# Patient Record
Sex: Female | Born: 1960 | Race: White | Hispanic: No | Marital: Married | Smoking: Never smoker
Health system: Southern US, Community
[De-identification: ages and names within clinical notes are randomized; demographics above are authoritative.]

## PROBLEM LIST (undated history)

## (undated) DIAGNOSIS — R51 Headache: Secondary | ICD-10-CM

## (undated) DIAGNOSIS — I10 Essential (primary) hypertension: Secondary | ICD-10-CM

## (undated) DIAGNOSIS — R790 Abnormal level of blood mineral: Secondary | ICD-10-CM

## (undated) DIAGNOSIS — M1990 Unspecified osteoarthritis, unspecified site: Secondary | ICD-10-CM

## (undated) DIAGNOSIS — E7800 Pure hypercholesterolemia, unspecified: Secondary | ICD-10-CM

## (undated) DIAGNOSIS — K760 Fatty (change of) liver, not elsewhere classified: Secondary | ICD-10-CM

## (undated) DIAGNOSIS — E119 Type 2 diabetes mellitus without complications: Secondary | ICD-10-CM

## (undated) DIAGNOSIS — Z86711 Personal history of pulmonary embolism: Secondary | ICD-10-CM

## (undated) DIAGNOSIS — K589 Irritable bowel syndrome without diarrhea: Secondary | ICD-10-CM

## (undated) DIAGNOSIS — F32A Depression, unspecified: Secondary | ICD-10-CM

## (undated) DIAGNOSIS — K529 Noninfective gastroenteritis and colitis, unspecified: Secondary | ICD-10-CM

## (undated) DIAGNOSIS — E785 Hyperlipidemia, unspecified: Secondary | ICD-10-CM

## (undated) DIAGNOSIS — F329 Major depressive disorder, single episode, unspecified: Secondary | ICD-10-CM

## (undated) DIAGNOSIS — N189 Chronic kidney disease, unspecified: Secondary | ICD-10-CM

## (undated) HISTORY — DX: Noninfective gastroenteritis and colitis, unspecified: K52.9

## (undated) HISTORY — PX: TONSILLECTOMY: SUR1361

## (undated) HISTORY — DX: Chronic kidney disease, unspecified: N18.9

## (undated) HISTORY — PX: AMPUTATION: SHX166

## (undated) HISTORY — DX: Irritable bowel syndrome, unspecified: K58.9

## (undated) HISTORY — PX: MANDIBLE FRACTURE SURGERY: SHX706

## (undated) HISTORY — DX: Personal history of pulmonary embolism: Z86.711

## (undated) HISTORY — DX: Essential (primary) hypertension: I10

## (undated) HISTORY — PX: PARTIAL HYSTERECTOMY: SHX80

## (undated) HISTORY — DX: Hemochromatosis, unspecified: E83.119

## (undated) HISTORY — DX: Type 2 diabetes mellitus without complications: E11.9

---

## 1997-05-09 ENCOUNTER — Encounter: Admission: RE | Admit: 1997-05-09 | Discharge: 1997-05-09 | Payer: Self-pay | Admitting: Family Medicine

## 1997-05-23 ENCOUNTER — Encounter: Admission: RE | Admit: 1997-05-23 | Discharge: 1997-05-23 | Payer: Self-pay | Admitting: Family Medicine

## 1997-05-29 ENCOUNTER — Encounter: Admission: RE | Admit: 1997-05-29 | Discharge: 1997-05-29 | Payer: Self-pay | Admitting: Family Medicine

## 1997-06-03 ENCOUNTER — Encounter: Admission: RE | Admit: 1997-06-03 | Discharge: 1997-06-03 | Payer: Self-pay | Admitting: Family Medicine

## 1997-06-05 ENCOUNTER — Encounter: Admission: RE | Admit: 1997-06-05 | Discharge: 1997-06-05 | Payer: Self-pay | Admitting: Family Medicine

## 1997-06-10 ENCOUNTER — Encounter: Admission: RE | Admit: 1997-06-10 | Discharge: 1997-06-10 | Payer: Self-pay | Admitting: Family Medicine

## 1997-08-01 ENCOUNTER — Encounter: Admission: RE | Admit: 1997-08-01 | Discharge: 1997-08-01 | Payer: Self-pay | Admitting: Family Medicine

## 1997-08-27 ENCOUNTER — Encounter: Admission: RE | Admit: 1997-08-27 | Discharge: 1997-08-27 | Payer: Self-pay | Admitting: Family Medicine

## 1997-11-15 ENCOUNTER — Encounter: Admission: RE | Admit: 1997-11-15 | Discharge: 1997-11-15 | Payer: Self-pay | Admitting: Family Medicine

## 1997-12-05 ENCOUNTER — Encounter: Admission: RE | Admit: 1997-12-05 | Discharge: 1997-12-05 | Payer: Self-pay | Admitting: Family Medicine

## 1998-01-22 ENCOUNTER — Emergency Department (HOSPITAL_COMMUNITY): Admission: EM | Admit: 1998-01-22 | Discharge: 1998-01-22 | Payer: Self-pay | Admitting: Emergency Medicine

## 1998-03-18 ENCOUNTER — Encounter: Admission: RE | Admit: 1998-03-18 | Discharge: 1998-03-18 | Payer: Self-pay | Admitting: Sports Medicine

## 1998-03-20 ENCOUNTER — Encounter: Admission: RE | Admit: 1998-03-20 | Discharge: 1998-03-20 | Payer: Self-pay | Admitting: Family Medicine

## 1998-03-24 ENCOUNTER — Encounter: Admission: RE | Admit: 1998-03-24 | Discharge: 1998-03-24 | Payer: Self-pay | Admitting: Sports Medicine

## 1998-04-03 ENCOUNTER — Encounter: Admission: RE | Admit: 1998-04-03 | Discharge: 1998-04-03 | Payer: Self-pay | Admitting: Family Medicine

## 1998-04-10 ENCOUNTER — Encounter: Admission: RE | Admit: 1998-04-10 | Discharge: 1998-04-10 | Payer: Self-pay | Admitting: Family Medicine

## 1998-04-22 ENCOUNTER — Encounter: Admission: RE | Admit: 1998-04-22 | Discharge: 1998-04-22 | Payer: Self-pay | Admitting: Family Medicine

## 1998-05-01 ENCOUNTER — Encounter: Admission: RE | Admit: 1998-05-01 | Discharge: 1998-05-01 | Payer: Self-pay | Admitting: Family Medicine

## 1998-05-19 ENCOUNTER — Encounter: Admission: RE | Admit: 1998-05-19 | Discharge: 1998-05-19 | Payer: Self-pay | Admitting: Family Medicine

## 1998-06-05 ENCOUNTER — Encounter: Admission: RE | Admit: 1998-06-05 | Discharge: 1998-06-05 | Payer: Self-pay | Admitting: Family Medicine

## 1998-06-12 ENCOUNTER — Encounter: Admission: RE | Admit: 1998-06-12 | Discharge: 1998-06-12 | Payer: Self-pay | Admitting: Family Medicine

## 1998-06-23 ENCOUNTER — Encounter: Admission: RE | Admit: 1998-06-23 | Discharge: 1998-06-23 | Payer: Self-pay | Admitting: Family Medicine

## 1998-07-01 ENCOUNTER — Encounter: Admission: RE | Admit: 1998-07-01 | Discharge: 1998-07-01 | Payer: Self-pay | Admitting: Sports Medicine

## 1998-07-03 ENCOUNTER — Encounter: Admission: RE | Admit: 1998-07-03 | Discharge: 1998-07-03 | Payer: Self-pay | Admitting: Family Medicine

## 1998-07-15 ENCOUNTER — Encounter: Admission: RE | Admit: 1998-07-15 | Discharge: 1998-07-15 | Payer: Self-pay | Admitting: Obstetrics & Gynecology

## 1998-08-05 ENCOUNTER — Encounter: Admission: RE | Admit: 1998-08-05 | Discharge: 1998-08-05 | Payer: Self-pay | Admitting: Obstetrics & Gynecology

## 1998-08-28 ENCOUNTER — Encounter: Admission: RE | Admit: 1998-08-28 | Discharge: 1998-08-28 | Payer: Self-pay | Admitting: Family Medicine

## 1998-09-08 ENCOUNTER — Encounter: Admission: RE | Admit: 1998-09-08 | Discharge: 1998-09-08 | Payer: Self-pay | Admitting: Family Medicine

## 1998-09-16 ENCOUNTER — Encounter: Admission: RE | Admit: 1998-09-16 | Discharge: 1998-09-16 | Payer: Self-pay | Admitting: Family Medicine

## 1998-09-17 ENCOUNTER — Encounter: Admission: RE | Admit: 1998-09-17 | Discharge: 1998-09-17 | Payer: Self-pay | Admitting: Family Medicine

## 1998-09-25 ENCOUNTER — Encounter: Admission: RE | Admit: 1998-09-25 | Discharge: 1998-09-25 | Payer: Self-pay | Admitting: Family Medicine

## 1998-11-04 ENCOUNTER — Encounter: Admission: RE | Admit: 1998-11-04 | Discharge: 1998-11-04 | Payer: Self-pay | Admitting: Family Medicine

## 1998-11-06 ENCOUNTER — Encounter: Admission: RE | Admit: 1998-11-06 | Discharge: 1998-11-06 | Payer: Self-pay | Admitting: Family Medicine

## 1998-12-17 ENCOUNTER — Encounter: Admission: RE | Admit: 1998-12-17 | Discharge: 1998-12-17 | Payer: Self-pay | Admitting: Family Medicine

## 1998-12-29 ENCOUNTER — Encounter: Admission: RE | Admit: 1998-12-29 | Discharge: 1998-12-29 | Payer: Self-pay | Admitting: Family Medicine

## 1999-01-20 ENCOUNTER — Encounter: Admission: RE | Admit: 1999-01-20 | Discharge: 1999-01-20 | Payer: Self-pay | Admitting: Family Medicine

## 1999-01-26 ENCOUNTER — Encounter: Admission: RE | Admit: 1999-01-26 | Discharge: 1999-01-26 | Payer: Self-pay | Admitting: Family Medicine

## 1999-02-19 ENCOUNTER — Encounter: Admission: RE | Admit: 1999-02-19 | Discharge: 1999-02-19 | Payer: Self-pay | Admitting: Family Medicine

## 1999-03-02 ENCOUNTER — Encounter: Admission: RE | Admit: 1999-03-02 | Discharge: 1999-03-02 | Payer: Self-pay | Admitting: Family Medicine

## 1999-03-09 ENCOUNTER — Encounter: Admission: RE | Admit: 1999-03-09 | Discharge: 1999-03-09 | Payer: Self-pay | Admitting: Family Medicine

## 1999-03-09 ENCOUNTER — Other Ambulatory Visit: Admission: RE | Admit: 1999-03-09 | Discharge: 1999-03-09 | Payer: Self-pay | Admitting: Family Medicine

## 1999-03-17 ENCOUNTER — Encounter: Admission: RE | Admit: 1999-03-17 | Discharge: 1999-03-17 | Payer: Self-pay | Admitting: Sports Medicine

## 1999-05-05 ENCOUNTER — Encounter: Admission: RE | Admit: 1999-05-05 | Discharge: 1999-05-05 | Payer: Self-pay | Admitting: Family Medicine

## 1999-05-13 ENCOUNTER — Encounter: Admission: RE | Admit: 1999-05-13 | Discharge: 1999-05-13 | Payer: Self-pay | Admitting: Family Medicine

## 1999-05-21 ENCOUNTER — Encounter: Admission: RE | Admit: 1999-05-21 | Discharge: 1999-05-21 | Payer: Self-pay | Admitting: Family Medicine

## 1999-06-02 ENCOUNTER — Encounter: Admission: RE | Admit: 1999-06-02 | Discharge: 1999-06-02 | Payer: Self-pay | Admitting: Sports Medicine

## 1999-06-17 ENCOUNTER — Encounter: Admission: RE | Admit: 1999-06-17 | Discharge: 1999-06-17 | Payer: Self-pay | Admitting: Family Medicine

## 1999-06-18 ENCOUNTER — Encounter: Admission: RE | Admit: 1999-06-18 | Discharge: 1999-06-18 | Payer: Self-pay | Admitting: Family Medicine

## 1999-06-18 ENCOUNTER — Encounter: Admission: RE | Admit: 1999-06-18 | Discharge: 1999-06-18 | Payer: Self-pay | Admitting: *Deleted

## 1999-08-10 ENCOUNTER — Encounter: Admission: RE | Admit: 1999-08-10 | Discharge: 1999-08-10 | Payer: Self-pay | Admitting: Family Medicine

## 1999-08-13 ENCOUNTER — Encounter: Admission: RE | Admit: 1999-08-13 | Discharge: 1999-08-13 | Payer: Self-pay | Admitting: Family Medicine

## 1999-09-03 ENCOUNTER — Encounter: Admission: RE | Admit: 1999-09-03 | Discharge: 1999-09-03 | Payer: Self-pay | Admitting: Family Medicine

## 1999-09-04 ENCOUNTER — Encounter: Admission: RE | Admit: 1999-09-04 | Discharge: 1999-09-04 | Payer: Self-pay | Admitting: Family Medicine

## 1999-09-29 ENCOUNTER — Encounter: Admission: RE | Admit: 1999-09-29 | Discharge: 1999-09-29 | Payer: Self-pay | Admitting: Family Medicine

## 1999-10-16 ENCOUNTER — Encounter: Admission: RE | Admit: 1999-10-16 | Discharge: 1999-10-16 | Payer: Self-pay | Admitting: Family Medicine

## 1999-10-21 ENCOUNTER — Encounter: Admission: RE | Admit: 1999-10-21 | Discharge: 1999-10-21 | Payer: Self-pay | Admitting: *Deleted

## 1999-11-10 ENCOUNTER — Encounter: Payer: Self-pay | Admitting: *Deleted

## 1999-11-10 ENCOUNTER — Encounter: Admission: RE | Admit: 1999-11-10 | Discharge: 1999-11-10 | Payer: Self-pay | Admitting: *Deleted

## 2000-03-10 ENCOUNTER — Encounter: Admission: RE | Admit: 2000-03-10 | Discharge: 2000-03-10 | Payer: Self-pay | Admitting: Family Medicine

## 2000-04-08 ENCOUNTER — Encounter (INDEPENDENT_AMBULATORY_CARE_PROVIDER_SITE_OTHER): Payer: Self-pay | Admitting: *Deleted

## 2000-04-11 ENCOUNTER — Encounter: Admission: RE | Admit: 2000-04-11 | Discharge: 2000-04-11 | Payer: Self-pay | Admitting: Family Medicine

## 2000-06-15 ENCOUNTER — Encounter: Admission: RE | Admit: 2000-06-15 | Discharge: 2000-06-15 | Payer: Self-pay | Admitting: Family Medicine

## 2000-07-07 ENCOUNTER — Encounter: Admission: RE | Admit: 2000-07-07 | Discharge: 2000-07-07 | Payer: Self-pay | Admitting: Family Medicine

## 2000-07-12 ENCOUNTER — Encounter: Admission: RE | Admit: 2000-07-12 | Discharge: 2000-07-12 | Payer: Self-pay | Admitting: Family Medicine

## 2000-08-04 ENCOUNTER — Encounter: Admission: RE | Admit: 2000-08-04 | Discharge: 2000-08-04 | Payer: Self-pay | Admitting: Family Medicine

## 2000-08-29 ENCOUNTER — Encounter: Admission: RE | Admit: 2000-08-29 | Discharge: 2000-08-29 | Payer: Self-pay | Admitting: Family Medicine

## 2000-08-29 ENCOUNTER — Encounter: Payer: Self-pay | Admitting: Family Medicine

## 2001-02-13 ENCOUNTER — Ambulatory Visit (HOSPITAL_COMMUNITY): Admission: RE | Admit: 2001-02-13 | Discharge: 2001-02-13 | Payer: Self-pay | Admitting: Internal Medicine

## 2001-02-13 ENCOUNTER — Encounter (INDEPENDENT_AMBULATORY_CARE_PROVIDER_SITE_OTHER): Payer: Self-pay | Admitting: Internal Medicine

## 2001-03-01 ENCOUNTER — Ambulatory Visit (HOSPITAL_COMMUNITY): Admission: RE | Admit: 2001-03-01 | Discharge: 2001-03-01 | Payer: Self-pay | Admitting: Internal Medicine

## 2001-03-08 ENCOUNTER — Encounter (INDEPENDENT_AMBULATORY_CARE_PROVIDER_SITE_OTHER): Payer: Self-pay | Admitting: Internal Medicine

## 2001-03-08 ENCOUNTER — Ambulatory Visit (HOSPITAL_COMMUNITY): Admission: RE | Admit: 2001-03-08 | Discharge: 2001-03-08 | Payer: Self-pay | Admitting: Internal Medicine

## 2001-05-26 ENCOUNTER — Encounter: Payer: Self-pay | Admitting: Gastroenterology

## 2001-05-26 ENCOUNTER — Ambulatory Visit (HOSPITAL_COMMUNITY): Admission: RE | Admit: 2001-05-26 | Discharge: 2001-05-26 | Payer: Self-pay | Admitting: Gastroenterology

## 2001-07-11 ENCOUNTER — Encounter: Admission: RE | Admit: 2001-07-11 | Discharge: 2001-07-11 | Payer: Self-pay | Admitting: Family Medicine

## 2001-09-12 ENCOUNTER — Encounter: Admission: RE | Admit: 2001-09-12 | Discharge: 2001-12-11 | Payer: Self-pay | Admitting: Family Medicine

## 2001-09-28 ENCOUNTER — Encounter: Payer: Self-pay | Admitting: Family Medicine

## 2001-09-28 ENCOUNTER — Encounter: Admission: RE | Admit: 2001-09-28 | Discharge: 2001-09-28 | Payer: Self-pay | Admitting: Family Medicine

## 2001-10-14 ENCOUNTER — Emergency Department (HOSPITAL_COMMUNITY): Admission: EM | Admit: 2001-10-14 | Discharge: 2001-10-14 | Payer: Self-pay | Admitting: *Deleted

## 2002-04-17 ENCOUNTER — Emergency Department (HOSPITAL_COMMUNITY): Admission: EM | Admit: 2002-04-17 | Discharge: 2002-04-17 | Payer: Self-pay | Admitting: Emergency Medicine

## 2002-04-17 ENCOUNTER — Encounter: Payer: Self-pay | Admitting: Emergency Medicine

## 2002-05-03 ENCOUNTER — Encounter: Payer: Self-pay | Admitting: Family Medicine

## 2002-05-03 ENCOUNTER — Encounter: Admission: RE | Admit: 2002-05-03 | Discharge: 2002-05-03 | Payer: Self-pay | Admitting: Family Medicine

## 2002-08-02 ENCOUNTER — Encounter (INDEPENDENT_AMBULATORY_CARE_PROVIDER_SITE_OTHER): Payer: Self-pay | Admitting: *Deleted

## 2002-08-02 ENCOUNTER — Observation Stay (HOSPITAL_COMMUNITY): Admission: RE | Admit: 2002-08-02 | Discharge: 2002-08-03 | Payer: Self-pay | Admitting: Obstetrics and Gynecology

## 2004-05-22 ENCOUNTER — Emergency Department (HOSPITAL_COMMUNITY): Admission: EM | Admit: 2004-05-22 | Discharge: 2004-05-22 | Payer: Self-pay | Admitting: Emergency Medicine

## 2004-07-09 ENCOUNTER — Other Ambulatory Visit: Admission: RE | Admit: 2004-07-09 | Discharge: 2004-07-09 | Payer: Self-pay | Admitting: Obstetrics and Gynecology

## 2004-10-13 ENCOUNTER — Ambulatory Visit: Payer: Self-pay | Admitting: Gastroenterology

## 2004-10-15 ENCOUNTER — Ambulatory Visit: Payer: Self-pay | Admitting: Gastroenterology

## 2004-10-15 ENCOUNTER — Encounter (INDEPENDENT_AMBULATORY_CARE_PROVIDER_SITE_OTHER): Payer: Self-pay | Admitting: Specialist

## 2004-10-27 ENCOUNTER — Ambulatory Visit: Payer: Self-pay | Admitting: Gastroenterology

## 2005-09-28 ENCOUNTER — Ambulatory Visit: Payer: Self-pay | Admitting: Gastroenterology

## 2005-11-11 ENCOUNTER — Ambulatory Visit: Payer: Self-pay | Admitting: Gastroenterology

## 2005-11-25 ENCOUNTER — Ambulatory Visit: Payer: Self-pay | Admitting: Gastroenterology

## 2006-04-08 ENCOUNTER — Encounter (INDEPENDENT_AMBULATORY_CARE_PROVIDER_SITE_OTHER): Payer: Self-pay | Admitting: *Deleted

## 2006-07-23 ENCOUNTER — Emergency Department (HOSPITAL_COMMUNITY): Admission: EM | Admit: 2006-07-23 | Discharge: 2006-07-23 | Payer: Self-pay | Admitting: Emergency Medicine

## 2006-10-26 ENCOUNTER — Ambulatory Visit: Payer: Self-pay | Admitting: Gastroenterology

## 2006-11-02 ENCOUNTER — Ambulatory Visit (HOSPITAL_COMMUNITY): Admission: RE | Admit: 2006-11-02 | Discharge: 2006-11-02 | Payer: Self-pay | Admitting: Gastroenterology

## 2006-12-15 ENCOUNTER — Encounter: Admission: RE | Admit: 2006-12-15 | Discharge: 2006-12-15 | Payer: Self-pay | Admitting: Family Medicine

## 2007-04-25 DIAGNOSIS — R142 Eructation: Secondary | ICD-10-CM

## 2007-04-25 DIAGNOSIS — R141 Gas pain: Secondary | ICD-10-CM | POA: Insufficient documentation

## 2007-04-25 DIAGNOSIS — R143 Flatulence: Secondary | ICD-10-CM

## 2007-04-25 DIAGNOSIS — R197 Diarrhea, unspecified: Secondary | ICD-10-CM | POA: Insufficient documentation

## 2007-04-25 DIAGNOSIS — K5909 Other constipation: Secondary | ICD-10-CM | POA: Insufficient documentation

## 2007-04-25 DIAGNOSIS — R110 Nausea: Secondary | ICD-10-CM | POA: Insufficient documentation

## 2007-04-25 DIAGNOSIS — K589 Irritable bowel syndrome without diarrhea: Secondary | ICD-10-CM | POA: Insufficient documentation

## 2007-07-10 ENCOUNTER — Encounter: Admission: RE | Admit: 2007-07-10 | Discharge: 2007-07-10 | Payer: Self-pay | Admitting: Family Medicine

## 2008-04-01 ENCOUNTER — Encounter: Admission: RE | Admit: 2008-04-01 | Discharge: 2008-04-01 | Payer: Self-pay | Admitting: Family Medicine

## 2008-07-30 ENCOUNTER — Encounter: Payer: Self-pay | Admitting: Gastroenterology

## 2008-10-20 ENCOUNTER — Encounter: Payer: Self-pay | Admitting: Gastroenterology

## 2008-11-05 ENCOUNTER — Telehealth: Payer: Self-pay | Admitting: Gastroenterology

## 2008-11-07 ENCOUNTER — Ambulatory Visit: Payer: Self-pay | Admitting: Gastroenterology

## 2008-11-07 DIAGNOSIS — R159 Full incontinence of feces: Secondary | ICD-10-CM | POA: Insufficient documentation

## 2008-11-11 ENCOUNTER — Ambulatory Visit: Payer: Self-pay | Admitting: Gastroenterology

## 2008-11-11 ENCOUNTER — Encounter: Payer: Self-pay | Admitting: Gastroenterology

## 2008-11-18 ENCOUNTER — Encounter: Payer: Self-pay | Admitting: Gastroenterology

## 2008-11-25 ENCOUNTER — Telehealth: Payer: Self-pay | Admitting: Physician Assistant

## 2008-12-13 ENCOUNTER — Telehealth: Payer: Self-pay | Admitting: Gastroenterology

## 2008-12-16 ENCOUNTER — Ambulatory Visit: Payer: Self-pay | Admitting: Gastroenterology

## 2008-12-17 ENCOUNTER — Encounter: Payer: Self-pay | Admitting: Gastroenterology

## 2009-01-20 ENCOUNTER — Ambulatory Visit: Payer: Self-pay | Admitting: Gastroenterology

## 2009-02-26 ENCOUNTER — Telehealth: Payer: Self-pay | Admitting: Gastroenterology

## 2009-03-07 ENCOUNTER — Telehealth (INDEPENDENT_AMBULATORY_CARE_PROVIDER_SITE_OTHER): Payer: Self-pay | Admitting: *Deleted

## 2009-03-21 ENCOUNTER — Telehealth: Payer: Self-pay | Admitting: Gastroenterology

## 2009-04-07 ENCOUNTER — Telehealth: Payer: Self-pay | Admitting: Gastroenterology

## 2009-04-21 ENCOUNTER — Encounter: Payer: Self-pay | Admitting: Gastroenterology

## 2009-06-30 ENCOUNTER — Telehealth: Payer: Self-pay | Admitting: Gastroenterology

## 2010-03-01 ENCOUNTER — Encounter: Payer: Self-pay | Admitting: Gastroenterology

## 2010-03-01 ENCOUNTER — Encounter: Payer: Self-pay | Admitting: Family Medicine

## 2010-03-10 NOTE — Progress Notes (Signed)
Summary: Triage  Phone Note Call from Patient Call back at Home Phone [phone]   Caller: Patient Call For: Dr. Arlyce Dice Reason for Call: Talk to Nurse Summary of Call: pt. would like to speak directly to nurse Initial call taken by: Karna Christmas,  Jun 30, 2009 3:07 PM  Follow-up for Phone Call        Message left to call back   Teryl Lucy RN  Jun 30, 2009 4:25 PM   Additional Follow-up for Phone Call Additional follow up Details #1::        C/O continued stool incontinence, unable to see surgical specialist due to insurance restrictions. Pt. will see Dr.Aryka Coonradt on 07-03-09 at 9:30am. Additional Follow-up by: Laureen Ochs LPN,  Jul 01, 2009 9:40 AM

## 2010-03-10 NOTE — Progress Notes (Signed)
Summary: Surgical Consult Scheduled  ---- Converted from flag ---- ---- 04/07/2009 11:12 AM, Louis Meckel MD wrote: Pt needs app't with Dr. Antionette Char and copy of her anal manometry report (in folder). ------------------------------  Phone Note Outgoing Call   Call placed by: Laureen Ochs LPN,  April 07, 2009 2:14 PM Call placed to: Patient Summary of Call: Per Dr.Kaplan--Pt. needs to see Dr.Ingram for a surgical consult, not Dr.Jackson-Moore. Records faxed to [facility], Appt. is scheduled for 04-21-09 at 10:30am. Message left for patient to callback.  Initial call taken by: Laureen Ochs LPN,  April 07, 2009 2:23 PM  Follow-up for Phone Call        Message left for patient to callback. Laureen Ochs LPN  April 08, 1608 9:02 AM  Above MD orders reviewed with patient. Pt. instructed to call back as needed.  Follow-up by: Laureen Ochs LPN,  April 08, 2009 10:38 AM

## 2010-03-10 NOTE — Op Note (Signed)
Summary: Anorectal Manometry/[facility]  Anorectal Manometry/[facility]   Imported By: Sherian Rein 04/09/2009 13:20:43  _____________________________________________________________________  External Attachment:    Type:   Image     Comment:   External Document

## 2010-03-10 NOTE — Progress Notes (Signed)
Summary: x-rays?  Phone Note Call from Patient Call back at Home Phone [phone]   Caller: Patient Call For: Dr. Arlyce Dice Reason for Call: Talk to Nurse Summary of Call: wanted to know if we ahve received x-rays from [facility] Initial call taken by: Vallarie Mare,  March 21, 2009 2:26 PM  Follow-up for Phone Call        Returned pts call, She wanted to know if we recieved her anal manometrey from [place]. Explained to her that we have not seen it yet. I pulled up her signed medical release and put Dr Nita Sells direct fax on it with attention to me and refaxed to [phone]. Follow-up by: Merri Ray CMA Duncan Dull),  March 21, 2009 2:41 PM

## 2010-03-10 NOTE — Progress Notes (Signed)
Summary: Medical Release completed  Medical Release completed. Faxed to [phone] for records for Dr. Arlyce Dice to review. [name]  March 07, 2009 11:43 AM

## 2010-03-10 NOTE — Progress Notes (Signed)
Summary: TRIAGE  Phone Note Call from Patient Call back at [phone]   Caller: Patient Call For: Arlyce Dice Reason for Call: Talk to Nurse Summary of Call: Patient states that she still has a lot of bowel incontinence and has alot of gas with it wants to know what to do. Initial call taken by: Tawni Levy,  February 26, 2009 10:08 AM  Follow-up for Phone Call         Last OV on 01-20-09. Her insurance won't cover another Anal Manometry because she already had one done in 2004.   Pt. continues with stool incontinence/leakage, she has to wear pads because of the stool issues. Now c/o increased gas/bloating. She does take fiber daily. Uses Beano as needed.  [facility] PLEASE ADVISE   Follow-up by: Laureen Ochs LPN,  February 26, 2009 10:16 AM  Additional Follow-up for Phone Call Additional follow up Details #1::        Need anal manometry report Additional Follow-up by: Louis Meckel MD,  February 26, 2009 1:04 PM    Additional Follow-up for Phone Call Additional follow up Details #2::    Pt. states she cannot afford to pay for the Anal Manometry and her insurance won't approve it.  Please advise. Follow-up by: Laureen Ochs LPN,  February 26, 2009 1:10 PM  Additional Follow-up for Phone Call Additional follow up Details #3:: Details for Additional Follow-up Action Taken: I need the report of the previous anal manometry Additional Follow-up by: Louis Meckel MD,  February 28, 2009 10:59 AM  I asked pt. to get a copy of the Anal Mano. report faxed  to [place] for Dr.Kaplan to review, pt. states it should be in her chart, Dr.Kaplan sent her to have it done. I will request her chart.Laureen Ochs LPN  February 28, 2009 11:16 AM  I have throughly reviewed pt. chart, I see no order for an Anal Manometry or any reports. I called [facility] and they do not have any record of pt. having it done there. I have left a message for the pt. to please get a copy of the report from wherever she  did have it done and have it sent to [place]. Or, pt. can let me get an appt. for her to have it done. I have requested pt. to callback. Laureen Ochs LPN  February 28, 2009 2:38 PM  Pt. states she had the Anal Mano.at [facility], she will have them fax the report for Dr.Kaplans review.  Laureen Ochs LPN  March 03, 2009 9:38 AM  Pt. asked me to call [phone] to fax a request for the report. I have left a msg. at that # for a callback. Laureen Ochs LPN  March 03, 2009 10:21 AM  Still no answer at above #, I left another msg. I have advised the pt. that it will be her responsibility to get a copy of this report to our office for Dr.Kaplan to review. She will call me to let me know when the report will be comming. Pt. instructed to call back as needed.

## 2010-03-10 NOTE — Consult Note (Signed)
Summary: [facility]   Imported By: Sherian Rein 06/17/2009 09:38:32  _____________________________________________________________________  External Attachment:    Type:   Image     Comment:   External Document

## 2010-05-18 ENCOUNTER — Observation Stay (HOSPITAL_COMMUNITY): Payer: Medicare Other

## 2010-05-18 ENCOUNTER — Ambulatory Visit (INDEPENDENT_AMBULATORY_CARE_PROVIDER_SITE_OTHER): Payer: Medicare Other | Admitting: Cardiovascular Disease

## 2010-05-18 ENCOUNTER — Encounter: Payer: Self-pay | Admitting: Cardiovascular Disease

## 2010-05-18 ENCOUNTER — Encounter (HOSPITAL_COMMUNITY): Payer: Self-pay | Admitting: Radiology

## 2010-05-18 ENCOUNTER — Observation Stay (HOSPITAL_COMMUNITY)
Admission: AD | Admit: 2010-05-18 | Discharge: 2010-05-19 | Disposition: A | Discharge status: Home / Self Care | Payer: Medicare Other | Source: Ambulatory Visit | Attending: Cardiovascular Disease | Admitting: Cardiovascular Disease

## 2010-05-18 DIAGNOSIS — K7689 Other specified diseases of liver: Secondary | ICD-10-CM | POA: Insufficient documentation

## 2010-05-18 DIAGNOSIS — Z79899 Other long term (current) drug therapy: Secondary | ICD-10-CM | POA: Insufficient documentation

## 2010-05-18 DIAGNOSIS — E7800 Pure hypercholesterolemia, unspecified: Secondary | ICD-10-CM | POA: Insufficient documentation

## 2010-05-18 DIAGNOSIS — I498 Other specified cardiac arrhythmias: Secondary | ICD-10-CM | POA: Insufficient documentation

## 2010-05-18 DIAGNOSIS — R079 Chest pain, unspecified: Secondary | ICD-10-CM | POA: Insufficient documentation

## 2010-05-18 DIAGNOSIS — I10 Essential (primary) hypertension: Secondary | ICD-10-CM

## 2010-05-18 LAB — COMPREHENSIVE METABOLIC PANEL
ALT: 59 U/L — ABNORMAL HIGH (ref 0–35)
Albumin: 4 g/dL (ref 3.5–5.2)
Alkaline Phosphatase: 64 U/L (ref 39–117)
BUN: 18 mg/dL (ref 6–23)
Calcium: 10.1 mg/dL (ref 8.4–10.5)
Potassium: 3.4 mEq/L — ABNORMAL LOW (ref 3.5–5.1)
Sodium: 139 mEq/L (ref 135–145)
Total Protein: 7.1 g/dL (ref 6.0–8.3)

## 2010-05-18 LAB — CBC
HCT: 41.9 % (ref 36.0–46.0)
Hemoglobin: 14.9 g/dL (ref 12.0–15.0)
MCV: 92.7 fL (ref 78.0–100.0)
RBC: 4.52 MIL/uL (ref 3.87–5.11)
WBC: 6.9 10*3/uL (ref 4.0–10.5)

## 2010-05-18 LAB — CARDIAC PANEL(CRET KIN+CKTOT+MB+TROPI)
CK, MB: 1.5 ng/mL (ref 0.3–4.0)
Relative Index: 0.9 (ref 0.0–2.5)
Total CK: 174 U/L (ref 7–177)

## 2010-05-18 LAB — PROTIME-INR: INR: 0.9 (ref 0.00–1.49)

## 2010-05-18 LAB — LIPID PANEL
Cholesterol: 322 mg/dL — ABNORMAL HIGH (ref 0–200)
HDL: 47 mg/dL (ref >39)
LDL Cholesterol: 202 mg/dL — ABNORMAL HIGH (ref 0–99)
Total CHOL/HDL Ratio: 6.9 RATIO
VLDL: 73 mg/dL — ABNORMAL HIGH (ref 0–40)

## 2010-05-18 LAB — TSH: TSH: 1.153 u[IU]/mL (ref 0.350–4.500)

## 2010-05-18 MED ORDER — IOHEXOL 300 MG/ML  SOLN
100.0000 mL | Freq: Once | INTRAMUSCULAR | Status: AC | PRN
  Administered 2010-05-18: 100 mL via INTRAVENOUS

## 2010-05-18 NOTE — Assessment & Plan Note (Signed)
Her blood pressure is well-controlled. We'll continue with her same medications. 

## 2010-05-18 NOTE — Assessment & Plan Note (Signed)
Lynnelle presents with episodes of chest pain for the past for 5 days. These episodes can last as long as several hours. Is not relieved with any specific actions. It is not worsened with deep breathing, change position, eating, drinking.  She's been anxious with these episodes of chest pain. She notes that there is a lot of stressful events going on in her house.    Her EKG reveals sinus tachycardia does not reveal any ST or T wave changes. I think our best option is to do to the hospital. We'll get cardiac enzymes every 8 hours x3. We'll check an EKG in the morning. We'll keep her n.p.o. For possible stress test versus cardiac catheterization in the morning. We'll get a CT angio of  the chest to rule out pulmonary embolus.
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
## 2010-05-19 DIAGNOSIS — R079 Chest pain, unspecified: Secondary | ICD-10-CM

## 2010-05-19 LAB — CARDIAC PANEL(CRET KIN+CKTOT+MB+TROPI)
CK, MB: 1.2 ng/mL (ref 0.3–4.0)
CK, MB: 1.1 ng/mL (ref 0.3–4.0)
Relative Index: 0.9 (ref 0.0–2.5)
Relative Index: 1 (ref 0.0–2.5)
Total CK: 112 U/L (ref 7–177)

## 2010-05-25 NOTE — Discharge Summary (Signed)
NAMEMARGARITA, [name] NO.:  [id number]  MEDICAL RECORD NO.:  [id number]           PATIENT TYPE:  O  LOCATION:  3742                         FACILITY:  [facility]  PHYSICIAN:  Vesta Mixer, M.D. DATE OF BIRTH:  November 06, 1960  DATE OF ADMISSION:  05/18/2010 DATE OF DISCHARGE:  05/19/2010                              DISCHARGE SUMMARY   PRIMARY CARDIOLOGIST:  Vesta Mixer, MD  DISCHARGE DIAGNOSES: 1. Chest pain, noncardiac.  Ruled out for myocardial infarction this     admission. 2. Hypercholesterolemia. 3. Tachycardia. 4. Hypertension.  PROCEDURES/DIAGNOSTICS PERFORMED DURING HOSPITALIZATION: 1. CT angiography of the chest with contrast on May 18, 2010:     Negative for acute pulmonary embolism or other acute chest process.     Small posterior mediastinal/paraspinal well-circumscribed soft     tissue nodule, likely incidental.  Hepatic steatosis. 2. EKG on May 19, 2010:  Normal sinus rhythm with nonspecific T-wave     abnormalities.  Rate at 97 beats per minute.  Axis is normal.     Intervals are normal.  REASON FOR HOSPITALIZATION:  This is a 50 year old female with the above- stated problem list who presented to the outpatient clinic for follow up with Dr. Elease Hashimoto with complaints of intermittent chest pain.  The patient's chest pain started after doing yard work over the past several days.  This episode lasted about several hours.  There is radiation out to her left arm.  Unsure if she has associated shortness of breath.  The patient denied any further chest pain.  There is mild nausea.  Denies any diaphoresis, PND, orthopnea, or syncope.  Her initial EKG at the office demonstrated sinus tachycardia without acute ST-T-wave changes. The patient has been under increased stress at home lately.  It was decided that the patient should be admitted to the hospital for rule out for myocardial infarction.  HOSPITAL COURSE:  The patient was admitted to the  telemetry unit.  She had no further complaints of chest pain.  She underwent a CT angio of the chest to rule out pulmonary embolism, as above no PE was found.  The patient ruled out for myocardial infarction with cardiac enzymes being negative x3.  The patient again had no further complaints of chest pain. With the patient ruling out myocardial infarction, it was felt that the patient had noncardiac source of her chest pain.  Lipase and amylase were drawn, these were within normal limits.  The patient did have positive fatty liver on her CT angio.  This will be followed as an outpatient.  A cholesterol panel was obtained, the patient had elevated cholesterol at 322, triglycerides 364, HDL 47, and LDL of 202.  Dr. Elease Hashimoto added Lipitor 40 mg daily to the patient's regimen.  The patient was tachycardic on exam as well as initial EKG.  A beta-blocker was added to her daily regimen.  In the hospital, the patient had borderline pressures, systolic BP being in the low 100s, therefore the patient's Norvasc was discontinued with the addition of her metoprolol.  She will be continued on hydrochlorothiazide and olmesartan in at this time.  The patient ambulated in the halls without any difficulty.  She denied any shortness of breath, chest pain, or nausea or vomiting.  Her heart rate remained around 84.  Outpatient exercise was stressed to the patient, she voiced understanding.  Dr. Elease Hashimoto evaluated the patient on day of discharge and felt her stable for home.  DISCHARGE LABORATORY DATA:  Lipase 21, amylase 41.  Cardiac enzymes negative x3.  TSH 1.153.  Hemoglobin A1c 5.4.  DISCHARGE MEDICATIONS: 1. Hydrochlorothiazide 25 mg daily. 2. Lipitor 40 mg daily. 3. Metoprolol 25 mg 1 tablet twice daily. 4. Losartan 40 mg daily. 5. Cymbalta 60 mg one capsule daily. 6. Flexeril 10 mg one-half to 1 tablet 3 times a day as needed for     muscle spasms. 7. Imitrex 100 mg 1 tablet every 2 hours as  needed for migraines. 8. Vitamin B12 1000 mcg/mL, 1 mL intramuscularly every Friday. 9. Vitamin D2 50,000 one capsule every Friday, 10.Zolpidem 10 mg 1 tablet daily at bedtime as needed for sleep.  Please stop taking Tribenzor.  FOLLOWUP PLANS AND INSTRUCTIONS: 1. The patient will follow up with Dr. Elease Hashimoto on Jun 09, 2010, at 1:45     pounds. 2. The patient should increase activity slowly. 3. The patient should continue low-sodium, heart-healthy diet. 4. The patient should stop any activity that causes chest pain or     shortness of breath. 5. The patient is to call the office in the interim for any problems     or concerns.  Duration of Discharge: Greater than 30 minutes with physician and physician extender time.   [name], PA-C   ______________________________ Vesta Mixer, M.D.    NB/MEDQ  D:  05/19/2010  T:  05/20/2010  Job:  [id number]  Electronically Signed by Alen Blew P.A. on 05/21/2010 02:10:16 PM Electronically Signed by Kristeen Miss M.D. on 05/25/2010 03:13:54 PM

## 2010-06-09 ENCOUNTER — Ambulatory Visit (INDEPENDENT_AMBULATORY_CARE_PROVIDER_SITE_OTHER): Payer: Medicare Other | Admitting: Cardiovascular Disease

## 2010-06-09 ENCOUNTER — Encounter: Payer: Self-pay | Admitting: Cardiovascular Disease

## 2010-06-09 VITALS — BP 148/100 | HR 88 | Wt 173.0 lb

## 2010-06-09 DIAGNOSIS — I10 Essential (primary) hypertension: Secondary | ICD-10-CM

## 2010-06-09 DIAGNOSIS — R079 Chest pain, unspecified: Secondary | ICD-10-CM

## 2010-06-09 MED ORDER — METOPROLOL TARTRATE 50 MG PO TABS: 50.0000 mg | ORAL_TABLET | Freq: Two times a day (BID) | ORAL | Status: DC
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
## 2010-06-09 NOTE — Assessment & Plan Note (Signed)
She has not had any further episodes of chest pain.

## 2010-06-09 NOTE — Assessment & Plan Note (Signed)
Her blood pressure and heart rate remained a little elevated. We will increase her metoprolol from 25 p.o. B.i.d. To 50 mg p.o. B.i.d.  I'll see her in 3 months.

## 2010-06-23 NOTE — Assessment & Plan Note (Signed)
[facility]                         GASTROENTEROLOGY OFFICE NOTE   NAME:[name], [name]                       MRN:          [id number]  DATE:10/26/2006                            DOB:          07-10-1960    PROBLEM:  Nausea.   [name] has returned complaining of nausea.  This may occur on an  empty stomach but clearly is exacerbated post prandially.  She complains  of abdominal bloating and distention as well.  She is without pain or  pyrosis.  She underwent upper endoscopy two years ago for abdominal  pain, which was normal.  She has IBS, although currently is not  complaining of diarrhea.  She is taking two antihypertensives whose  names she does not recall.  She is also taking Ambien.   PHYSICAL EXAMINATION:  Pulse 100, blood pressure 110/72, weight 161.   PHYSICAL EXAMINATION:  HEENT: EOMI.  PERRLA.  Sclerae are anicteric.  Conjunctivae are pink.  NECK:  Supple without thyromegaly, adenopathy or carotid bruits.  CHEST:  Clear to auscultation and percussion without adventitious  sounds.  CARDIAC:  Regular rhythm; normal S1 S2.  There are no murmurs, gallops  or rubs.  ABDOMEN:  There is no succussion splash.  Abdomen is without masses,  tenderness, or organomegaly.  EXTREMITIES:  Full range of motion.  No cyanosis, clubbing or edema.  RECTAL:  Deferred.   IMPRESSION:  Nausea.  This could be a medication effect.  Gastroparesis  is also a consideration.  I think it is less likely that it is related  to gastroesophageal reflux disease.   RECOMMENDATIONS:  Gastric emptying scan.  I have instructed [name] to  bring in her medicines for review on our next visit.     Barbette Hair. Arlyce Dice, MD,FACG  Electronically Signed    RDK/MedQ  DD: 10/26/2006  DT: 10/26/2006  Job #: [id number]   cc:   Ace Gins, MD

## 2010-06-26 NOTE — Op Note (Signed)
NAMEANTONIETTE, [name]                          ACCOUNT NO.:  [id number]   MEDICAL RECORD NO.:  [id number]                   PATIENT TYPE:  OBV   LOCATION:  9399                                 FACILITY:  [facility]   PHYSICIAN:  Michelle L. Vincente Poli, M.D.            DATE OF BIRTH:  August 30, 1960   DATE OF PROCEDURE:  08/02/2002  DATE OF DISCHARGE:                                 OPERATIVE REPORT   PREOPERATIVE DIAGNOSES:  1. Pelvic pain.  2. Dysfunctional uterine bleeding.   POSTOPERATIVE DIAGNOSES:  1. Pelvic pain.  2. Dysfunctional uterine bleeding.   PROCEDURE:  Laparoscopic-assisted vaginal hysterectomy.   SURGEON:  Michelle L. Vincente Poli, M.D.   ASSISTANT:  Dr. Rosalia Hammers.   ANESTHESIA:  General.   ESTIMATED BLOOD LOSS:  200 mL.   FINDINGS:  Normal adnexa.  Uterus grossly consistent with adenomyosis.   PROCEDURE:  The patient was taken to the operating room, she was intubated  after she was positioned in the lithotomy position.  She was then prepped  and draped in the usual sterile fashion.  A Foley catheter was inserted into  the bladder after sterile drape was applied.  Using a scalpel a small  infraumbilical incision was made.  The Veress needle was inserted with the  first attempt.  The pneumoperitoneum was achieved and then a 10-11 mm trocar  was inserted in the same incision.  The patient was placed in Trendelenburg  position and the laparoscope was introduced and the secondary site trocar  was placed under direct visualization using the 5-mm incision.  Using a  probe, the anterior abdomen and pelvis was inspected.  The abdomen and  pelvis were normal.  Adnexa were normal.  There was no evidence of  endometriosis.  Using the Gyrus instrument, the triple pedicle was  identified and both sides were electrocoagulated and then it was transected  with good hemostasis.  We then carried this down to the round ligament on  both sides with good hemostasis.  Attention was then turned  vaginally.  The  patient's legs were then placed in high lithotomy and a circumferential  incision was made around the cervix and the overlying vaginal epithelium was  pushed away inferiorly, laterally, and posteriorly.  The posterior cul-de-  sac was entered sharply and a weighted speculum was inserted into the  posterior cul-de-sac.  Using the curved Haney clamps the uterosacral  cardinal complexes were then clamped on either side. Each pedicle was cut  and suture ligated using 0 Vicryl suture.  We then turned our attention  anteriorly where the anterior cul-de-sac was entered sharply.  We then  proceeded with going along the broad ligament using curved Haney clamps.  Each pedicle was cut and suture ligated using 0 Vicryl suture.  We then  reached the level of the triple pedicle which had already been transected on  either side.  The uterus was retroflexed and then the  uterus was then  removed.  A sterile sponge was inserted into the peritoneal cavity and the  pedicles were all inspected and noted to be hemostatic.  The posterior cuff  was closed with continuous running-lock stitch using 2-0 Vicryl suture.  The  ______ sponge was removed and the vaginal cuff was closed using 0 Vicryl in  a continuous running-lock stitch.  A sponge stick was placed in the vagina  and attention was then turned back to the abdomen.  The pneumoperitoneum was  then performed again.  All pedicles were inspected, irrigation was  performed, and hemostasis was noted.  At the end of the procedure all  pneumoperitoneum was released, the trocars were removed from the abdominal  cavity, and the incisions were closed with 3-0 Vicryl interrupted.  All  sponge, lap, and instrument counts were correct x2.  The patient tolerated  the procedure well and went to the recovery room in stable condition.                                               Michelle L. Vincente Poli, M.D.    Florestine Avers  D:  08/02/2002  T:  08/04/2002  Job:   [id number]

## 2010-06-26 NOTE — Assessment & Plan Note (Signed)
[facility]                           GASTROENTEROLOGY OFFICE NOTE   NAME:RYALSHennesy, Sobalvarro                       MRN:          [id number]  DATE:09/28/2005                            DOB:          April 03, 1960    PROBLEM:  Diarrhea.   Ms. Robinette has returned for reevaluation.  Diarrhea continues consisting of  poorly formed stools post prandial.  She will occasionally awaken with the  urge to defecate.  She is taking hyoscyamine without much relief.  Previous  biopsies of the rectum did not demonstrate any abnormalities.  Random  biopsies of the colon were also normal.   PHYSICAL EXAMINATION:  VITAL SIGNS:  Pulse 80, blood pressure 126/78, weight  143.   IMPRESSION:  Irritable bowel syndrome/diarrhea.   RECOMMENDATIONS:  Patient will consider enrollment in irritable bowel  syndrome trial.                                   Barbette Hair. Arlyce Dice, MD, [facility]   RDK/MedQ  DD:  09/28/2005  DT:  09/28/2005  Job #:  [id number]   cc:   Elvina Sidle, MD

## 2010-07-08 ENCOUNTER — Ambulatory Visit: Payer: Medicare Other | Admitting: Cardiology

## 2010-07-15 ENCOUNTER — Telehealth: Payer: Self-pay | Admitting: *Deleted

## 2010-07-15 DIAGNOSIS — E785 Hyperlipidemia, unspecified: Secondary | ICD-10-CM

## 2010-07-15 MED ORDER — LOSARTAN POTASSIUM 100 MG PO TABS: 100.0000 mg | ORAL_TABLET | Freq: Every day | ORAL | Status: DC

## 2010-07-15 MED ORDER — SIMVASTATIN 40 MG PO TABS: 40.0000 mg | ORAL_TABLET | Freq: Every day | ORAL | Status: DC

## 2010-07-15 NOTE — Telephone Encounter (Signed)
Pt walked in for med management. Diovan and lipitor not covered by insurance, pt has [employer] coverage. Pt on benicar for bp and nothing for cholesterol. Yesterday eve  bp 190/104 . Current 166/94. Feels ok currently, reviewed sodium content of foods to avoid. Pt was cleared of heart problems with last hospitalization. Pt obtaining pcp for HTN/hyperlipid management. Can you advise two diff meds. Pt warned about s/s of stroke with bp elevated and told to go to er if any complication and to monitor bp daily.

## 2010-07-15 NOTE — Telephone Encounter (Signed)
Spoke with DOD/ Dr Patty Sermons, I called pharmacy and they made suggestions to what her insurance would cover. Comparable meds were prescribed and escribed, pt then called and informed to pick up and start asap. Pt to monitor bp daily and call weekly with results till stable and to follow up for 3 mo labs here. App and orders placed. Pt verbalized understanding. Alfonso Ramus RN

## 2010-10-15 ENCOUNTER — Other Ambulatory Visit: Payer: Medicare Other | Admitting: *Deleted

## 2010-10-19 DIAGNOSIS — R0989 Other specified symptoms and signs involving the circulatory and respiratory systems: Secondary | ICD-10-CM | POA: Insufficient documentation

## 2010-10-19 DIAGNOSIS — S88111A Complete traumatic amputation at level between knee and ankle, right lower leg, initial encounter: Secondary | ICD-10-CM | POA: Insufficient documentation

## 2010-10-26 ENCOUNTER — Other Ambulatory Visit: Payer: Self-pay | Admitting: Sports Medicine

## 2010-10-26 ENCOUNTER — Ambulatory Visit
Admission: RE | Admit: 2010-10-26 | Discharge: 2010-10-26 | Disposition: A | Discharge status: Home / Self Care | Payer: Medicare Other | Source: Ambulatory Visit | Attending: Sports Medicine | Admitting: Sports Medicine

## 2010-10-26 DIAGNOSIS — M79605 Pain in left leg: Secondary | ICD-10-CM

## 2011-01-04 ENCOUNTER — Ambulatory Visit (HOSPITAL_BASED_OUTPATIENT_CLINIC_OR_DEPARTMENT_OTHER): Payer: Medicare Other | Admitting: Physical Medicine & Rehabilitation

## 2011-01-04 ENCOUNTER — Encounter: Payer: Medicare Other | Attending: Physical Medicine & Rehabilitation

## 2011-01-04 DIAGNOSIS — T8789 Other complications of amputation stump: Secondary | ICD-10-CM | POA: Insufficient documentation

## 2011-01-04 DIAGNOSIS — Y835 Amputation of limb(s) as the cause of abnormal reaction of the patient, or of later complication, without mention of misadventure at the time of the procedure: Secondary | ICD-10-CM | POA: Insufficient documentation

## 2011-01-04 DIAGNOSIS — G8921 Chronic pain due to trauma: Secondary | ICD-10-CM

## 2011-01-04 DIAGNOSIS — G547 Phantom limb syndrome without pain: Secondary | ICD-10-CM
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
## 2011-01-12 ENCOUNTER — Other Ambulatory Visit: Payer: Self-pay | Admitting: Cardiovascular Disease

## 2011-01-18 ENCOUNTER — Encounter: Payer: Medicare Other | Admitting: Physical Medicine & Rehabilitation

## 2011-03-10 ENCOUNTER — Ambulatory Visit (INDEPENDENT_AMBULATORY_CARE_PROVIDER_SITE_OTHER): Payer: 59 | Admitting: Cardiovascular Disease

## 2011-03-10 ENCOUNTER — Encounter: Payer: Self-pay | Admitting: Cardiovascular Disease

## 2011-03-10 DIAGNOSIS — R079 Chest pain, unspecified: Secondary | ICD-10-CM

## 2011-03-10 DIAGNOSIS — I10 Essential (primary) hypertension: Secondary | ICD-10-CM

## 2011-03-10 DIAGNOSIS — E785 Hyperlipidemia, unspecified: Secondary | ICD-10-CM

## 2011-03-10 MED ORDER — METOPROLOL TARTRATE 50 MG PO TABS: 50.0000 mg | ORAL_TABLET | Freq: Two times a day (BID) | ORAL | Status: DC

## 2011-03-10 MED ORDER — LOSARTAN POTASSIUM 100 MG PO TABS: 100.0000 mg | ORAL_TABLET | Freq: Every day | ORAL | Status: DC

## 2011-03-10 MED ORDER — ATORVASTATIN CALCIUM 20 MG PO TABS: 20.0000 mg | ORAL_TABLET | Freq: Every day | ORAL | Status: DC

## 2011-03-10 MED ORDER — SIMVASTATIN 40 MG PO TABS: 40.0000 mg | ORAL_TABLET | Freq: Every day | ORAL | Status: DC

## 2011-03-10 NOTE — Assessment & Plan Note (Signed)
[name]'s  blood pressure is much better today. I think that she may have had some salty food. We'll continue with her same medications.  Office visit 1

## 2011-03-10 NOTE — Patient Instructions (Addendum)
Your physician has requested that you have a lexiscan myoview. For further information please visit [email or web address] Please follow instruction sheet, as given.   Your physician recommends that you schedule a follow-up appointment in: 1 month/ FASTING LABS  Your physician has recommended you make the following change in your medication:   STOP SIMVASTATIN STOP BENICAR  START ATORVASTATIN 20 MG DAILY FOR YOUR CHOLESTEROL    Your physician recommends that you return for a FASTING lipid profile: 1 MONTH
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
## 2011-03-10 NOTE — Assessment & Plan Note (Signed)
[name] presents with a two-week history of intermittent chest discomfort. Her symptoms are somewhat atypical. She does have some risk factors of coronary artery disease. We'll schedule her for a Lexus scan Myoview study. I'll see her back in one month for followup visit.

## 2011-03-17 ENCOUNTER — Ambulatory Visit (HOSPITAL_COMMUNITY): Payer: Medicare HMO | Attending: Cardiovascular Disease | Admitting: Radiology

## 2011-03-17 DIAGNOSIS — R079 Chest pain, unspecified: Secondary | ICD-10-CM

## 2011-03-17 DIAGNOSIS — R0602 Shortness of breath: Secondary | ICD-10-CM

## 2011-03-17 DIAGNOSIS — E785 Hyperlipidemia, unspecified: Secondary | ICD-10-CM | POA: Insufficient documentation

## 2011-03-17 DIAGNOSIS — R0609 Other forms of dyspnea: Secondary | ICD-10-CM | POA: Insufficient documentation

## 2011-03-17 DIAGNOSIS — I10 Essential (primary) hypertension: Secondary | ICD-10-CM

## 2011-03-17 DIAGNOSIS — R42 Dizziness and giddiness: Secondary | ICD-10-CM | POA: Insufficient documentation

## 2011-03-17 DIAGNOSIS — R000 Tachycardia, unspecified: Secondary | ICD-10-CM | POA: Insufficient documentation

## 2011-03-17 DIAGNOSIS — R0789 Other chest pain: Secondary | ICD-10-CM | POA: Insufficient documentation

## 2011-03-17 DIAGNOSIS — R0989 Other specified symptoms and signs involving the circulatory and respiratory systems: Secondary | ICD-10-CM | POA: Insufficient documentation

## 2011-03-17 DIAGNOSIS — R002 Palpitations: Secondary | ICD-10-CM | POA: Insufficient documentation

## 2011-03-17 DIAGNOSIS — R61 Generalized hyperhidrosis: Secondary | ICD-10-CM | POA: Insufficient documentation

## 2011-03-17 DIAGNOSIS — R5381 Other malaise: Secondary | ICD-10-CM | POA: Insufficient documentation

## 2011-03-17 DIAGNOSIS — Z8249 Family history of ischemic heart disease and other diseases of the circulatory system: Secondary | ICD-10-CM | POA: Insufficient documentation

## 2011-03-17 DIAGNOSIS — R209 Unspecified disturbances of skin sensation: Secondary | ICD-10-CM | POA: Insufficient documentation

## 2011-03-17 MED ORDER — TECHNETIUM TC 99M TETROFOSMIN IV KIT
33.0000 | PACK | Freq: Once | INTRAVENOUS | Status: AC | PRN
  Administered 2011-03-17: 33 via INTRAVENOUS

## 2011-03-17 MED ORDER — REGADENOSON 0.4 MG/5ML IV SOLN
0.4000 mg | Freq: Once | INTRAVENOUS | Status: AC
  Administered 2011-03-17: 0.4 mg via INTRAVENOUS

## 2011-03-17 MED ORDER — TECHNETIUM TC 99M TETROFOSMIN IV KIT
11.0000 | PACK | Freq: Once | INTRAVENOUS | Status: AC | PRN
  Administered 2011-03-17: 11 via INTRAVENOUS

## 2011-03-17 NOTE — Progress Notes (Signed)
[facility] SITE 3 NUCLEAR MED [address] [place] [address] [phone]  Cardiology Nuclear Med Study  [name] is a 51 y.o. female [id number] Mar 18, 1960   Nuclear Med Background Indication for Stress Test:  Evaluation for Ischemia History:  No previous documented CAD Cardiac Risk Factors: Family History - CAD, Hypertension and Lipids  Symptoms:  Chest Pain/Tightness with (L) Arm/Hand Numbness (last episode of chest discomfort was last night), Diaphoresis, Dizziness, DOE, Fatigue, Palpitations and Rapid HR   Nuclear Pre-Procedure Caffeine/Decaff Intake:  None NPO After: 9:00pm   Lungs:  Clear.  O2 SAT 96% on RA IV 0.9% NS with Angio Cath:  20g  IV Site: R Antecubital  IV Started by:  Stanton Kidney, EMT-P  Chest Size (in):  38 Cup Size: C  Height: 5\' 4"  (1.626 m)  Weight:  168 lb (76.204 kg)  BMI:  Body mass index is 28.84 kg/(m^2). Tech Comments:  Metoprolol held > 24 hours, per patient.    Nuclear Med Study 1 or 2 day study: 1 day  Stress Test Type:  Eugenie Birks  Reading MD: Charlton Haws, MD  Order Authorizing Provider:  Kristeen Miss, MD  Resting Radionuclide: Technetium 88m Tetrofosmin  Resting Radionuclide Dose: 11.0 mCi   Stress Radionuclide:  Technetium 48m Tetrofosmin  Stress Radionuclide Dose: 33.0 mCi           Stress Protocol Rest HR: 79 Stress HR: 123  Rest BP: 146/93 Stress BP: 164/87  Exercise Time (min): n/a METS: n/a   Predicted Max HR: 171 bpm % Max HR: 71.93 bpm Rate Pressure Product: 21308   Dose of Adenosine (mg):  n/a Dose of Lexiscan: 0.4 mg  Dose of Atropine (mg): n/a Dose of Dobutamine: n/a mcg/kg/min (at max HR)  Stress Test Technologist: Smiley Houseman, CMA-N  Nuclear Technologist:  Domenic Polite, CNMT     Rest Procedure:  Myocardial perfusion imaging was performed at rest 45 minutes following the intravenous administration of Technetium 28m Tetrofosmin.  Rest ECG: Nonspecific T-wave changes.  Stress  Procedure:  The patient received IV Lexiscan 0.4 mg over 15-seconds.  Technetium 60m Tetrofosmin injected at 30-seconds.  There were no significant changes with Lexiscan. She did have a mild hypertensive response to Lexiscan, 147/108.   Quantitative spect images were obtained after a 45 minute delay.  Stress ECG: No significant change from baseline ECG  QPS Raw Data Images:  Normal; no motion artifact; normal heart/lung ratio. Stress Images:  Normal homogeneous uptake in all areas of the myocardium. Rest Images:  Normal homogeneous uptake in all areas of the myocardium. Subtraction (SDS):  Normal Transient Ischemic Dilatation (Normal <1.22):  1.14 Lung/Heart Ratio (Normal <0.45):  0.33  Quantitative Gated Spect Images QGS EDV:  59 ml QGS ESV:  16 ml QGS cine images:  NL LV Function; NL Wall Motion QGS EF: 72%  Impression Exercise Capacity:  Lexiscan with no exercise. BP Response:  Normal blood pressure response. Clinical Symptoms:  No chest pain. ECG Impression:  No significant ST segment change suggestive of ischemia. Comparison with Prior Nuclear Study: No previous nuclear study performed  Overall Impression:  Normal stress nuclear study.     Charlton Haws

## 2011-04-05 ENCOUNTER — Other Ambulatory Visit: Payer: 59

## 2011-04-07 ENCOUNTER — Ambulatory Visit: Payer: 59 | Admitting: Cardiovascular Disease

## 2011-05-20 ENCOUNTER — Encounter (HOSPITAL_COMMUNITY): Payer: Self-pay | Admitting: Emergency Medicine

## 2011-05-20 ENCOUNTER — Emergency Department (HOSPITAL_COMMUNITY)
Admission: EM | Admit: 2011-05-20 | Discharge: 2011-05-20 | Disposition: A | Discharge status: Home / Self Care | Payer: 59 | Attending: Emergency Medicine | Admitting: Emergency Medicine

## 2011-05-20 DIAGNOSIS — I10 Essential (primary) hypertension: Secondary | ICD-10-CM | POA: Insufficient documentation

## 2011-05-20 DIAGNOSIS — H9209 Otalgia, unspecified ear: Secondary | ICD-10-CM | POA: Insufficient documentation

## 2011-05-20 DIAGNOSIS — H9203 Otalgia, bilateral: Secondary | ICD-10-CM

## 2011-05-20 DIAGNOSIS — E785 Hyperlipidemia, unspecified: Secondary | ICD-10-CM | POA: Insufficient documentation

## 2011-05-20 HISTORY — DX: Hyperlipidemia, unspecified: E78.5

## 2011-05-20 MED ORDER — ANTIPYRINE-BENZOCAINE 5.4-1.4 % OT SOLN
3.0000 [drp] | Freq: Once | OTIC | Status: AC
  Administered 2011-05-20: 4 [drp] via OTIC
  Filled 2011-05-20: qty 10

## 2011-05-20 NOTE — ED Provider Notes (Signed)
Medical screening examination/treatment/procedure(s) were performed by non-physician practitioner and as supervising physician I was immediately available for consultation/collaboration.   Loren Racer, MD 05/20/11 [phone]
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
## 2011-05-20 NOTE — ED Notes (Signed)
Pt reports 2 month hx of bilat. Ear pain-Tx by PCP with amoxicillin. refered to ENT. Also reports itchy throat. Having difficulty locating an ENT, seen by [facility] allergist, denies seasonal allergies

## 2011-05-20 NOTE — Discharge Instructions (Signed)
You may use ear drops (3-4 drops to each ear 3 times daily as needed).  Follow up with ENT doctor for further evaluation.    Otalgia The most common reason for this in children is an infection of the middle ear. Pain from the middle ear is usually caused by a build-up of fluid and pressure behind the eardrum. Pain from an earache can be sharp, dull, or burning. The pain may be temporary or constant. The middle ear is connected to the nasal passages by a short narrow tube called the Eustachian tube. The Eustachian tube allows fluid to drain out of the middle ear, and helps keep the pressure in your ear equalized. CAUSES  A cold or allergy can block the Eustachian tube with inflammation and the build-up of secretions. This is especially likely in small children, because their Eustachian tube is shorter and more horizontal. When the Eustachian tube closes, the normal flow of fluid from the middle ear is stopped. Fluid can accumulate and cause stuffiness, pain, hearing loss, and an ear infection if germs start growing in this area. SYMPTOMS  The symptoms of an ear infection may include fever, ear pain, fussiness, increased crying, and irritability. Many children will have temporary and minor hearing loss during and right after an ear infection. Permanent hearing loss is rare, but the risk increases the more infections a child has. Other causes of ear pain include retained water in the outer ear canal from swimming and bathing. Ear pain in adults is less likely to be from an ear infection. Ear pain may be referred from other locations. Referred pain may be from the joint between your jaw and the skull. It may also come from a tooth problem or problems in the neck. Other causes of ear pain include:  A foreign body in the ear.   Outer ear infection.   Sinus infections.   Impacted ear wax.   Ear injury.   Arthritis of the jaw or TMJ problems.   Middle ear infection.   Tooth infections.   Sore  throat with pain to the ears.  DIAGNOSIS  Your caregiver can usually make the diagnosis by examining you. Sometimes other special studies, including x-rays and lab work may be necessary. TREATMENT   If antibiotics were prescribed, use them as directed and finish them even if you or your child's symptoms seem to be improved.   Sometimes PE tubes are needed in children. These are little plastic tubes which are put into the eardrum during a simple surgical procedure. They allow fluid to drain easier and allow the pressure in the middle ear to equalize. This helps relieve the ear pain caused by pressure changes.  HOME CARE INSTRUCTIONS   Only take over-the-counter or prescription medicines for pain, discomfort, or fever as directed by your caregiver. DO NOT GIVE CHILDREN ASPIRIN because of the association of Reye's Syndrome in children taking aspirin.   Use a cold pack applied to the outer ear for 15 to 20 minutes, 3 to 4 times per day or as needed may reduce pain. Do not apply ice directly to the skin. You may cause frost bite.   Over-the-counter ear drops used as directed may be effective. Your caregiver may sometimes prescribe ear drops.   Resting in an upright position may help reduce pressure in the middle ear and relieve pain.   Ear pain caused by rapidly descending from high altitudes can be relieved by swallowing or chewing gum. Allowing infants to suck on a  bottle during airplane travel can help.   Do not smoke in the house or near children. If you are unable to quit smoking, smoke outside.   Control allergies.  SEEK IMMEDIATE MEDICAL CARE IF:   You or your child are becoming sicker.   Pain or fever relief is not obtained with medicine.   You or your child's symptoms (pain, fever, or irritability) do not improve within 24 to 48 hours or as instructed.   Severe pain suddenly stops hurting. This may indicate a ruptured eardrum.   You or your children develop new problems such as  severe headaches, stiff neck, difficulty swallowing, or swelling of the face or around the ear.  Document Released: 09/12/2003 Document Revised: 01/14/2011 Document Reviewed: 01/17/2008 [facility] Patient Information 2012 [place], [place].

## 2011-08-11 ENCOUNTER — Other Ambulatory Visit: Payer: Self-pay | Admitting: Sports Medicine

## 2011-08-11 DIAGNOSIS — M25562 Pain in left knee: Secondary | ICD-10-CM

## 2011-08-13 ENCOUNTER — Ambulatory Visit
Admission: RE | Admit: 2011-08-13 | Discharge: 2011-08-13 | Disposition: A | Discharge status: Home / Self Care | Payer: 59 | Source: Ambulatory Visit | Attending: Sports Medicine | Admitting: Sports Medicine

## 2011-08-13 DIAGNOSIS — M25562 Pain in left knee: Secondary | ICD-10-CM

## 2011-09-24 ENCOUNTER — Telehealth: Payer: Self-pay | Admitting: Gastroenterology

## 2011-09-24 NOTE — Telephone Encounter (Signed)
Pt states she is having bad diarrhea. She is going out of town next week for vacation and needs to be seen. Pt states at times she cannot control the diarrhea, she has tried Imodium and it has not helped. Pt scheduled to see Willette Cluster NP 09/29/11@2 :30pm. Pt aware of appt date and time.

## 2011-09-29 ENCOUNTER — Encounter: Payer: Self-pay | Admitting: *Deleted

## 2011-09-29 ENCOUNTER — Ambulatory Visit: Payer: 59 | Admitting: Nurse Practitioner

## 2011-10-01 ENCOUNTER — Ambulatory Visit (INDEPENDENT_AMBULATORY_CARE_PROVIDER_SITE_OTHER): Payer: 59 | Admitting: Nurse Practitioner

## 2011-10-01 ENCOUNTER — Encounter: Payer: Self-pay | Admitting: Nurse Practitioner

## 2011-10-01 VITALS — BP 130/80 | HR 88 | Ht 62.75 in | Wt 176.0 lb

## 2011-10-01 DIAGNOSIS — R197 Diarrhea, unspecified: Secondary | ICD-10-CM

## 2011-10-01 MED ORDER — DIPHENOXYLATE-ATROPINE 2.5-0.025 MG PO TABS: 1.0000 | ORAL_TABLET | Freq: Four times a day (QID) | ORAL | Status: AC | PRN

## 2011-10-01 MED ORDER — METRONIDAZOLE 250 MG PO TABS: 250.0000 mg | ORAL_TABLET | Freq: Three times a day (TID) | ORAL | Status: AC

## 2011-10-01 NOTE — Patient Instructions (Addendum)
Your physician has requested that you go to the basement for the following lab work before leaving today: Stool studies  We have sent the following medications to your pharmacy for you to pick up at your convenience: Flagyl and Lomotil; please take as directed.  Please call the office in 1 week if you are not feeling any better.

## 2011-10-01 NOTE — Progress Notes (Signed)
[name] [id number] Mar 03, 1960   HISTORY OR PRESENT ILLNESS :  Patient is a 51 year old female known to Dr. Arlyce Dice for  IBS and fecal incontinence. She was last seen in 2010. Patient presents today with a two week history of diarrhea. She is having several loose stools a day and nocturnal diarrhea as well. Immodium not helping. Diarrhea is non-bloody, not unusually malodorous. No associated cramps. No recent travel, no sick contacts. She hasn't started in new meds lately. No dietary changes. No antibiotics in last few months.  Today she has had multiple loose stools. She complains of feeling bloated.  Patient had a colonoscopy in September 2006 with findings of proctitis. Rectal and colon biopsies were normal however. She had a flexible sigmoidoscopy in 2010 with findings of mild proctitis. Biopsies c/w mild inflammation, no features of chronicity.   Current Medications, Allergies, Past Medical History, Past Surgical History, Family History and Social History were reviewed in [employer] record.   PHYSICAL EXAMINATION : General:  Well developed  female in no acute distress Head: Normocephalic and atraumatic Eyes:  sclerae anicteric,conjunctive pink. Ears: Normal auditory acuity Neck: Supple, no masses.  Lungs: Clear throughout to auscultation Heart: Regular rate and rhythm Abdomen: Soft, nondistended, nontender. No masses or hepatomegaly noted. Normal bowel sounds Rectal: not done Musculoskeletal: Symmetrical with no gross deformities  Skin: No lesions on visible extremities Extremities: No edema or deformities noted Neurological: Oriented x 4, grossly nonfocal Cervical Nodes:  No significant cervical adenopathy Psychological:  Alert and cooperative. Normal mood and affect  ASSESSMENT AND PLAN :  acute diarrhea, likely infectious. Patient going out of town, she is concerned about the diarrhea. Need to check stool studies which I will ask her to submit today.  Following submission of stool studies will treat her empirically with Flagyl. She can use Lomotil as Imodium is not really helping her. Will call patient to get condition update and discuss test results in a few days.

## 2011-10-04 ENCOUNTER — Other Ambulatory Visit: Payer: 59

## 2011-10-04 ENCOUNTER — Ambulatory Visit: Payer: 59 | Admitting: Nurse Practitioner

## 2011-10-04 DIAGNOSIS — R197 Diarrhea, unspecified: Secondary | ICD-10-CM

## 2011-10-05 LAB — CLOSTRIDIUM DIFFICILE BY PCR: Toxigenic C. Difficile by PCR: NOT DETECTED

## 2011-10-05 LAB — CRYPTOSPORIDIUM SMEAR, FECAL

## 2011-10-05 NOTE — Progress Notes (Signed)
I agree with the plan outlined in this note 

## 2011-10-06 ENCOUNTER — Telehealth: Payer: Self-pay | Admitting: Gastroenterology

## 2011-10-06 NOTE — Telephone Encounter (Signed)
Pt called about her c diff stool test. Discussed with pt that she is negative for c diff.

## 2011-10-12 ENCOUNTER — Telehealth: Payer: Self-pay | Admitting: Nurse Practitioner

## 2011-10-12 NOTE — Telephone Encounter (Signed)
Spoke with patient and gave her results as per Willette Cluster, NP. Per patient the Flagyl has not helped her diarrhea at all. States she has too many stools to count. Denies pain or bleeding just diarrhea stools. She is almost finished with course of Flagyl. Does have slight nausea.

## 2011-10-15 ENCOUNTER — Telehealth: Payer: Self-pay | Admitting: Gastroenterology

## 2011-10-15 ENCOUNTER — Other Ambulatory Visit (INDEPENDENT_AMBULATORY_CARE_PROVIDER_SITE_OTHER): Payer: Medicare HMO

## 2011-10-15 ENCOUNTER — Other Ambulatory Visit: Payer: Self-pay | Admitting: *Deleted

## 2011-10-15 ENCOUNTER — Ambulatory Visit (INDEPENDENT_AMBULATORY_CARE_PROVIDER_SITE_OTHER): Payer: 59 | Admitting: Physician Assistant

## 2011-10-15 ENCOUNTER — Telehealth: Payer: Self-pay | Admitting: *Deleted

## 2011-10-15 ENCOUNTER — Encounter: Payer: Self-pay | Admitting: Physician Assistant

## 2011-10-15 VITALS — BP 136/82 | HR 76 | Ht 62.75 in | Wt 173.4 lb

## 2011-10-15 DIAGNOSIS — E785 Hyperlipidemia, unspecified: Secondary | ICD-10-CM

## 2011-10-15 DIAGNOSIS — R197 Diarrhea, unspecified: Secondary | ICD-10-CM

## 2011-10-15 DIAGNOSIS — R634 Abnormal weight loss: Secondary | ICD-10-CM

## 2011-10-15 LAB — CBC WITH DIFFERENTIAL/PLATELET
Basophils Absolute: 0 10*3/uL (ref 0.0–0.1)
Eosinophils Absolute: 0.1 10*3/uL (ref 0.0–0.7)
HCT: 42.1 % (ref 36.0–46.0)
Lymphs Abs: 2.3 10*3/uL (ref 0.7–4.0)
MCHC: 34 g/dL (ref 30.0–36.0)
MCV: 97.8 fl (ref 78.0–100.0)
Monocytes Absolute: 0.5 10*3/uL (ref 0.1–1.0)
Monocytes Relative: 5.3 % (ref 3.0–12.0)
Platelets: 316 10*3/uL (ref 150.0–400.0)
RDW: 12.3 % (ref 11.5–14.6)

## 2011-10-15 LAB — BASIC METABOLIC PANEL
Calcium: 9.6 mg/dL (ref 8.4–10.5)
GFR: 119.2 mL/min (ref >60.00)
Sodium: 142 mEq/L (ref 135–145)

## 2011-10-15 LAB — SEDIMENTATION RATE: Sed Rate: 16 mm/hr (ref 0–22)

## 2011-10-15 MED ORDER — PROMETHAZINE HCL 12.5 MG PO TABS: 12.5000 mg | ORAL_TABLET | Freq: Four times a day (QID) | ORAL | Status: DC | PRN

## 2011-10-15 MED ORDER — MOVIPREP 100 G PO SOLR: 1.0000 | Freq: Once | ORAL | Status: AC

## 2011-10-15 MED ORDER — DIPHENOXYLATE-ATROPINE 2.5-0.025 MG PO TABS: 1.0000 | ORAL_TABLET | Freq: Four times a day (QID) | ORAL | Status: AC | PRN

## 2011-10-15 NOTE — Patient Instructions (Addendum)
Please go to the basement level to have your labs drawn.  We sent a prescription for Phenergan 12.5 mg to your pharmacy, [facility] [place], [place]. We have given you a printed prescription to take to your pharmacy for Lomotil.   We scheduled the Colonoscopy with Dr. Melvia Heaps the week he is in the hospital doing procedures Directions and brochure provided.

## 2011-10-15 NOTE — Progress Notes (Signed)
Subjective:    Patient ID: [name], female    DOB: 11-Jun-1960, 51 y.o.   MRN: [id number]  HPI [name] is a pleasant 51 year old female known to Dr. Arlyce Dice who carries a prior to diagnosis of IBS with intermittent incontinence. She was seen here a couple of weeks ago by Willette Cluster NP at that time with 1-2 weeks history of severe diarrhea. It was felt this was most likely infectious and she was given an impaired course of Flagyl. She also had stool for O&P and stool for C. difficile by PCR both of which were negative. Lactoferrin was positive. Patient has undergone prior sigmoidoscopy for diarrhea- the last was done in 2010. At that time she was noted to have mild proctitis. Biopsies showed mild inflammation of the no evidence for chronic IBD She also had colonoscopy in 2006 she also showed mild proctitis but rectal and colon biopsies were normal. The biopsies from 2010 showed scattered lymphoid aggregates.  Patient comes back in today because her diarrhea has not improved at all. She feels it is a bit worse. She says over the past several years she has had episodes of diarrhea which may last for a day or 2 followed by 2-3 days with no bowel movement. At this time she is having diarrhea on a daily basis and up to 10-12 times per day she is usually getting up in the middle of the night with diarrhea as well which she describes as watery and mucoid nonbloody. She is having some associated nausea no pain or cramping but says that her abdomen feels "unsettled". She has not had any fever or chills but does and or sweats. She says she really hasn't felt well over the past couple of weeks -feels  fatigued  she is eating less and has lost about 3 pounds. She had been taking Imodium which she stopped because it wasn't helpful and had tried Kaopectate without benefit as well. She has Zofran at home which she is using for nausea but also feels that it is not helping.   Review of Systems  Constitutional:  Positive for fatigue and unexpected weight change.  HENT: Negative.   Eyes: Negative.   Respiratory: Negative.   Cardiovascular: Negative.   Gastrointestinal: Positive for nausea and diarrhea.  Genitourinary: Negative.   Musculoskeletal: Positive for gait problem.  Neurological: Negative.   Hematological: Negative.   Psychiatric/Behavioral: Negative.    Outpatient Prescriptions Prior to Visit  Medication Sig Dispense Refill  . atorvastatin (LIPITOR) 40 MG tablet Take 40 mg by mouth daily.      . Attapulgite (KAOPECTATE PO) Take 1 tablet by mouth as needed.      Marland Kitchen losartan (COZAAR) 100 MG tablet Take 1 tablet (100 mg total) by mouth daily.  30 tablet  5  . Loperamide HCl (IMODIUM A-D PO) Take 1 tablet by mouth as needed.          No Known Allergies Patient Active Problem List  Diagnosis  . IRRITABLE BOWEL SYNDROME  . NAUSEA  . ABDOMINAL BLOATING  . RECTAL INCONTINENCE  . DIARRHEA  . Hypertension  . Chest pain  . Hyperlipidemia   History   Social History  . Marital Status: Married    Spouse Name: N/A    Number of Children: 2  . Years of Education: N/A   Occupational History  . Not on file.   Social History Main Topics  . Smoking status: Never Smoker   . Smokeless tobacco: Never Used  .  Alcohol Use: No  . Drug Use: No  . Sexually Active: Not on file   Other Topics Concern  . Not on file   Social History Narrative  . No narrative on file    Objective:   Physical Exam well-developed white female in no acute distress, pleasant pressure 136/82 pulse 76 height 5 foot 2 weight 173. 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        Assessment & Plan:  # .  51 year old female with prior history of IBS with intermittent diarrhea now with progressive daily diarrhea over the past 3-4 weeks. Stool for lactoferrin was positive other infectious workup negative. No response to impaired course of Flagyl. Prior biopsies done in 2010 showed mild proctitis with evidence of inflammation but no chronic changes of IBD. I am concerned that she does have an underlying inflammatory bowel disease, or perhaps microscopic colitis. Also need to rule out celiac disease.   Plan; we'll check CBC, CRP, sedimentation rate and celiac panel today Schedule for repeat colonoscopy with Dr. Arlyce Dice with random biopsies and intubation of the terminal ileum to rule out an underlying colitis. Procedure was discussed in detail with the patient and she is agreeable to proceed. In the interim will use Lomotil up to 4 daily as needed for diarrhea Start Phenergan 12.5 mg every 6 hours as needed for nausea Further plans pending results of labs and colonoscopy.

## 2011-10-15 NOTE — Progress Notes (Signed)
Reviewed and agree with management plans.  Artie Takayama T. Delawrence Fridman MD FACG  

## 2011-10-15 NOTE — Telephone Encounter (Signed)
[name] pt with history of IBS and fecal incontinence. Pt saw Gunnar Fusi 10/01/11 for diarrhea. Pt had 2 week history of diarrhea at the visit. Stool studies were ordered and cdiff, these were normal. Pt was placed on Flagyl, she has finished this and states it has not helped. She was also given Lomotil. Pt states she has had 5 stools so far this morning. Reports she usually is nauseated first and then has to go to the bathroom and has a watery stool and has seen some mucous but no blood. Pt scheduled to see Mike Gip PA today at 2pm. Pt aware of appt date and time.

## 2011-10-15 NOTE — Telephone Encounter (Signed)
Pt called to report that her ins wont cover the prep for her colonoscopy Monday.  I told her to meet me at the [facility] on Sat 10-16-2011 at 10 Am.  I will give her a Moviprpep sample.  [facility] wanted to charge her $150.00.

## 2011-10-15 NOTE — Telephone Encounter (Signed)
Per Selinda Michaels, RN- patient is scheduled for OV today with Mike Gip, PA for continued diarrhea.

## 2011-10-17 ENCOUNTER — Telehealth: Payer: Self-pay | Admitting: Gastroenterology

## 2011-10-17 MED ORDER — ONDANSETRON HCL 4 MG PO TABS: 8.0000 mg | ORAL_TABLET | Freq: Two times a day (BID) | ORAL | Status: AC | PRN

## 2011-10-17 NOTE — Telephone Encounter (Signed)
She was seen last week in office, scheduled for colonoscopy tomorrow with Dr. Arlyce Dice.  Has 'terrible nausea/vomiting.'  Has been nauseas for 2-3 weeks, intermittent vomiting.  She is vomiting small amounts while on clear liquid diet today in prep for colonoscoyp tomorrow.  Has not started prep yet, but worried that she is already very nauseas.  Has phenergan 12.5, taken 2 today but makes her very sleepy.  I'm going to call in zofran 8mg  pill, she will take one asap today and another later on this evening to try to get her prepped.

## 2011-10-18 ENCOUNTER — Ambulatory Visit (HOSPITAL_COMMUNITY)
Admission: RE | Admit: 2011-10-18 | Discharge: 2011-10-18 | Disposition: A | Discharge status: Home / Self Care | Payer: Medicare HMO | Source: Ambulatory Visit | Attending: Gastroenterology | Admitting: Gastroenterology

## 2011-10-18 ENCOUNTER — Telehealth: Payer: Self-pay | Admitting: Gastroenterology

## 2011-10-18 ENCOUNTER — Encounter (HOSPITAL_COMMUNITY): Payer: Self-pay | Admitting: *Deleted

## 2011-10-18 ENCOUNTER — Encounter (HOSPITAL_COMMUNITY)
Admission: RE | Disposition: A | Discharge status: Home / Self Care | Payer: Self-pay | Source: Ambulatory Visit | Attending: Gastroenterology

## 2011-10-18 DIAGNOSIS — K5289 Other specified noninfective gastroenteritis and colitis: Secondary | ICD-10-CM | POA: Insufficient documentation

## 2011-10-18 DIAGNOSIS — K529 Noninfective gastroenteritis and colitis, unspecified: Secondary | ICD-10-CM

## 2011-10-18 DIAGNOSIS — R197 Diarrhea, unspecified: Secondary | ICD-10-CM | POA: Insufficient documentation

## 2011-10-18 DIAGNOSIS — Z79899 Other long term (current) drug therapy: Secondary | ICD-10-CM | POA: Insufficient documentation

## 2011-10-18 DIAGNOSIS — R634 Abnormal weight loss: Secondary | ICD-10-CM

## 2011-10-18 DIAGNOSIS — E785 Hyperlipidemia, unspecified: Secondary | ICD-10-CM | POA: Insufficient documentation

## 2011-10-18 DIAGNOSIS — K589 Irritable bowel syndrome without diarrhea: Secondary | ICD-10-CM | POA: Insufficient documentation

## 2011-10-18 DIAGNOSIS — I10 Essential (primary) hypertension: Secondary | ICD-10-CM | POA: Insufficient documentation

## 2011-10-18 HISTORY — DX: Headache: R51

## 2011-10-18 HISTORY — DX: Depression, unspecified: F32.A

## 2011-10-18 HISTORY — DX: Noninfective gastroenteritis and colitis, unspecified: K52.9

## 2011-10-18 HISTORY — DX: Major depressive disorder, single episode, unspecified: F32.9

## 2011-10-18 HISTORY — PX: COLONOSCOPY: SHX5424

## 2011-10-18 HISTORY — DX: Unspecified osteoarthritis, unspecified site: M19.90

## 2011-10-18 LAB — GLIADIN ANTIBODIES, SERUM
Gliadin IgA: 3.7 U/mL (ref <20)
Gliadin IgG: 4.7 U/mL (ref <20)

## 2011-10-18 SURGERY — COLONOSCOPY
Anesthesia: Moderate Sedation

## 2011-10-18 MED ORDER — DIPHENHYDRAMINE HCL 50 MG/ML IJ SOLN
INTRAMUSCULAR | Status: DC | PRN
  Administered 2011-10-18 (×2): 25 mg via INTRAVENOUS

## 2011-10-18 MED ORDER — FENTANYL CITRATE 0.05 MG/ML IJ SOLN
INTRAMUSCULAR | Status: AC
  Filled 2011-10-18: qty 4

## 2011-10-18 MED ORDER — DIPHENHYDRAMINE HCL 50 MG/ML IJ SOLN
INTRAMUSCULAR | Status: AC
  Filled 2011-10-18: qty 1

## 2011-10-18 MED ORDER — SODIUM CHLORIDE 0.9 % IV SOLN
INTRAVENOUS | Status: DC
  Administered 2011-10-18: 500 mL via INTRAVENOUS

## 2011-10-18 MED ORDER — MIDAZOLAM HCL 10 MG/2ML IJ SOLN
INTRAMUSCULAR | Status: AC
  Filled 2011-10-18: qty 4

## 2011-10-18 MED ORDER — FENTANYL CITRATE 0.05 MG/ML IJ SOLN
INTRAMUSCULAR | Status: AC
  Filled 2011-10-18: qty 2

## 2011-10-18 MED ORDER — FENTANYL CITRATE 0.05 MG/ML IJ SOLN
INTRAMUSCULAR | Status: DC | PRN
  Administered 2011-10-18 (×5): 25 ug via INTRAVENOUS

## 2011-10-18 MED ORDER — MIDAZOLAM HCL 10 MG/2ML IJ SOLN
INTRAMUSCULAR | Status: AC
  Filled 2011-10-18: qty 2

## 2011-10-18 MED ORDER — MIDAZOLAM HCL 5 MG/5ML IJ SOLN
INTRAMUSCULAR | Status: DC | PRN
  Administered 2011-10-18 (×6): 2 mg via INTRAVENOUS

## 2011-10-18 NOTE — H&P (View-Only) (Signed)
Subjective:    Patient ID: [name], female    DOB: 11/26/1960, 51 y.o.   MRN: [id number]  HPI [name] is a pleasant 51-year-old female known to Dr. Kaplan who carries a prior to diagnosis of IBS with intermittent incontinence. She was seen here a couple of weeks ago by Paula Guenther NP at that time with 1-2 weeks history of severe diarrhea. It was felt this was most likely infectious and she was given an impaired course of Flagyl. She also had stool for O&P and stool for C. difficile by PCR both of which were negative. Lactoferrin was positive. Patient has undergone prior sigmoidoscopy for diarrhea- the last was done in 2010. At that time she was noted to have mild proctitis. Biopsies showed mild inflammation of the no evidence for chronic IBD She also had colonoscopy in 2006 she also showed mild proctitis but rectal and colon biopsies were normal. The biopsies from 2010 showed scattered lymphoid aggregates.  Patient comes back in today because her diarrhea has not improved at all. She feels it is a bit worse. She says over the past several years she has had episodes of diarrhea which may last for a day or 2 followed by 2-3 days with no bowel movement. At this time she is having diarrhea on a daily basis and up to 10-12 times per day she is usually getting up in the middle of the night with diarrhea as well which she describes as watery and mucoid nonbloody. She is having some associated nausea no pain or cramping but says that her abdomen feels "unsettled". She has not had any fever or chills but does and or sweats. She says she really hasn't felt well over the past couple of weeks -feels  fatigued  she is eating less and has lost about 3 pounds. She had been taking Imodium which she stopped because it wasn't helpful and had tried Kaopectate without benefit as well. She has Zofran at home which she is using for nausea but also feels that it is not helping.   Review of Systems  Constitutional:  Positive for fatigue and unexpected weight change.  HENT: Negative.   Eyes: Negative.   Respiratory: Negative.   Cardiovascular: Negative.   Gastrointestinal: Positive for nausea and diarrhea.  Genitourinary: Negative.   Musculoskeletal: Positive for gait problem.  Neurological: Negative.   Hematological: Negative.   Psychiatric/Behavioral: Negative.    Outpatient Prescriptions Prior to Visit  Medication Sig Dispense Refill  . atorvastatin (LIPITOR) 40 MG tablet Take 40 mg by mouth daily.      . Attapulgite (KAOPECTATE PO) Take 1 tablet by mouth as needed.      . losartan (COZAAR) 100 MG tablet Take 1 tablet (100 mg total) by mouth daily.  30 tablet  5  . Loperamide HCl (IMODIUM A-D PO) Take 1 tablet by mouth as needed.          No Known Allergies Patient Active Problem List  Diagnosis  . IRRITABLE BOWEL SYNDROME  . NAUSEA  . ABDOMINAL BLOATING  . RECTAL INCONTINENCE  . DIARRHEA  . Hypertension  . Chest pain  . Hyperlipidemia   History   Social History  . Marital Status: Married    Spouse Name: N/A    Number of Children: 2  . Years of Education: N/A   Occupational History  . Not on file.   Social History Main Topics  . Smoking status: Never Smoker   . Smokeless tobacco: Never Used  .   Alcohol Use: No  . Drug Use: No  . Sexually Active: Not on file   Other Topics Concern  . Not on file   Social History Narrative  . No narrative on file    Objective:   Physical Exam well-developed white female in no acute distress, pleasant pressure 136/82 pulse 76 height 5 foot 2 weight 173. 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        Assessment & Plan:  # .  51-year-old female with prior history of IBS with intermittent diarrhea now with progressive daily diarrhea over the past 3-4 weeks. Stool for lactoferrin was positive other infectious workup negative. No response to impaired course of Flagyl. Prior biopsies done in 2010 showed mild proctitis with evidence of inflammation but no chronic changes of IBD. I am concerned that she does have an underlying inflammatory bowel disease, or perhaps microscopic colitis. Also need to rule out celiac disease.   Plan; we'll check CBC, CRP, sedimentation rate and celiac panel today Schedule for repeat colonoscopy with Dr. Kaplan with random biopsies and intubation of the terminal ileum to rule out an underlying colitis. Procedure was discussed in detail with the patient and she is agreeable to proceed. In the interim will use Lomotil up to 4 daily as needed for diarrhea Start Phenergan 12.5 mg every 6 hours as needed for nausea Further plans pending results of labs and colonoscopy.  

## 2011-10-18 NOTE — Telephone Encounter (Signed)
Discussed with pt that they will receive a letter in the mail once the results are back from the biopsy.

## 2011-10-18 NOTE — Interval H&P Note (Signed)
History and Physical Interval Note:  10/18/2011 11:24 AM  [name]  has presented today for surgery, with the diagnosis of Diarrhea  The various methods of treatment have been discussed with the patient and family. After consideration of risks, benefits and other options for treatment, the patient has consented to  Procedure(s) (LRB) with comments: COLONOSCOPY (N/A) as a surgical intervention .  The patient's history has been reviewed, patient examined, no change in status, stable for surgery.  I have reviewed the patient's chart and labs.  Questions were answered to the patient's satisfaction.    The recent H&P (dated **10/15/11 ) was reviewed, the patient was examined and there is no change in the patients condition since that H&P was completed.   [name]  10/18/2011, 11:24 AM    [name]

## 2011-10-18 NOTE — Op Note (Signed)
[facility] [address] [place], [address]   COLONOSCOPY PROCEDURE REPORT  PATIENT: Zen, Felling  MR#: [id number] BIRTHDATE: 12-08-1960 , 51  yrs. old GENDER: Female ENDOSCOPIST: Louis Meckel, MD REFERRED BY: PROCEDURE DATE:  10/18/2011 PROCEDURE:   Colonoscopy with biopsy ASA CLASS:   Class II INDICATIONS:unexplained diarrhea. MEDICATIONS: These medications were titrated to patient response per physician's verbal order, Fentanyl 125 mcg IV, Versed 12 mg IV, and Benadryl 50 mg IV  DESCRIPTION OF PROCEDURE:   After the risks benefits and alternatives of the procedure were thoroughly explained, informed consent was obtained.  A digital rectal exam revealed no abnormalities of the rectum.   The Pentax Colonoscope [id number] endoscope was introduced through the anus and advanced to the terminal ileum which was intubated for a short distance. No adverse events experienced.   The quality of the prep was Suprep excellent The instrument was then slowly withdrawn as the colon was fully examined.      COLON FINDINGS: In the rectum and sigmoid to approximately 20 cm there appeared to be punctate areas of erythema.  biopsies were taken.  The remainder of the colon was entirely normal.  Random biopsies were taken to rule out microscopic colitis.  Examination of the terminal ileum was also unremarkable.   In the rectum and sigmoid to approximately 20 cm there appeared to be punctate areas of erythema.  biopsies were taken.  The remainder of the colon was entirely normal.  Random biopsies were taken to rule out microscopic colitis the colon proximal to the sigmoid. Examination of the terminal ileum was also unremarkable.  Retroflexion was not performed due to a narrow rectal vault. The time to cecum=  . Withdrawal time=10 minutes 0 seconds.  The scope was withdrawn and the procedure completed. COMPLICATIONS: There were no complications.  ENDOSCOPIC  IMPRESSION: 1.   nonspecific proctocolitis  RECOMMENDATIONS: await biopsy results   eSigned:  Louis Meckel, MD 10/18/2011 12:12 PM   cc:   PATIENT NAME:  Elvia, Aydin MR#: [id number]

## 2011-10-19 ENCOUNTER — Encounter (HOSPITAL_COMMUNITY): Payer: Self-pay | Admitting: Gastroenterology

## 2011-10-21 ENCOUNTER — Other Ambulatory Visit: Payer: Self-pay | Admitting: Gastroenterology

## 2011-10-21 MED ORDER — MESALAMINE 1.2 G PO TBEC: 2400.0000 mg | DELAYED_RELEASE_TABLET | Freq: Every day | ORAL | Status: DC

## 2011-11-01 ENCOUNTER — Encounter: Payer: Self-pay | Admitting: Gastroenterology

## 2011-11-01 ENCOUNTER — Ambulatory Visit (INDEPENDENT_AMBULATORY_CARE_PROVIDER_SITE_OTHER): Payer: Medicare HMO | Admitting: Gastroenterology

## 2011-11-01 VITALS — BP 130/80 | HR 66 | Ht 63.75 in | Wt 176.2 lb

## 2011-11-01 DIAGNOSIS — K5289 Other specified noninfective gastroenteritis and colitis: Secondary | ICD-10-CM

## 2011-11-01 DIAGNOSIS — K529 Noninfective gastroenteritis and colitis, unspecified: Secondary | ICD-10-CM

## 2011-11-01 NOTE — Assessment & Plan Note (Addendum)
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 She clearly seems to be responding to lialda. Plan to continue with the same.

## 2011-11-01 NOTE — Patient Instructions (Addendum)
Follow-up in 6 weeks

## 2011-11-01 NOTE — Progress Notes (Signed)
History of Present Illness:  Mrs. Deroy returns following colonoscopy. Again noted were what appeared to be inflammatory changes confined to the distal colon. Random biopsies were negative.  Biopsies of the rectum and sigmoid demonstrated changes suggestive of mucosal prolapse only. There is no evidence for chronic or acute inflammation. She was started nonetheless on lialda and reports significant improvement. Diarrhea has largely subsided and energy level has increased.    Review of Systems: Pertinent positive and negative review of systems were noted in the above HPI section. All other review of systems were otherwise negative.    Current Medications, Allergies, Past Medical History, Past Surgical History, Family History and Social History were reviewed in [employer] electronic medical record  Vital signs were reviewed in today's medical record. Physical Exam: General: Well developed , well nourished, no acute distress

## 2011-11-09 ENCOUNTER — Telehealth: Payer: Self-pay | Admitting: Gastroenterology

## 2011-11-09 NOTE — Telephone Encounter (Signed)
Stop lialda  Begin ProctoFoam one each bedtime. Call back in 2-3 days.

## 2011-11-09 NOTE — Telephone Encounter (Signed)
Pt states she is having diarrhea like she was before her colonoscopy. Pt is taking Lialda 2.4 gms daily. States it was helping but she started having diarrhea yesterday. Pt states she had 5 diarrhea stools yesterday and today she has been going off and on since 8am. Dr. Arlyce Dice please advise.

## 2011-11-10 ENCOUNTER — Telehealth: Payer: Self-pay | Admitting: Gastroenterology

## 2011-11-10 MED ORDER — HYDROCORTISONE 100 MG/60ML RE ENEM: 100.0000 mg | ENEMA | Freq: Every day | RECTAL | Status: DC

## 2011-11-10 MED ORDER — HYDROCORTISONE ACE-PRAMOXINE 1-1 % RE FOAM: 1.0000 | Freq: Every evening | RECTAL | Status: DC

## 2011-11-10 NOTE — Addendum Note (Signed)
Addended by: Selinda Michaels R on: 11/10/2011 04:25 PM   Modules accepted: Orders

## 2011-11-10 NOTE — Telephone Encounter (Signed)
New script sent to pharmacy, pt aware.

## 2011-11-10 NOTE — Telephone Encounter (Signed)
Pt aware and script sent to pharmacy. 

## 2011-11-10 NOTE — Telephone Encounter (Signed)
Pt called and states that her insurance will not cover the proctofoam, needs something different called in. Please advise.

## 2011-11-10 NOTE — Telephone Encounter (Signed)
Try cort  enemas

## 2011-11-12 ENCOUNTER — Other Ambulatory Visit: Payer: Self-pay | Admitting: *Deleted

## 2011-11-12 MED ORDER — SIMVASTATIN 40 MG PO TABS: 40.0000 mg | ORAL_TABLET | Freq: Every evening | ORAL | Status: DC

## 2011-11-12 NOTE — Telephone Encounter (Signed)
Opened in Error.

## 2011-11-12 NOTE — Telephone Encounter (Signed)
Fax Received. Refill Completed. [name] (R.M.A)   

## 2011-11-14 ENCOUNTER — Telehealth: Payer: Self-pay | Admitting: Internal Medicine

## 2011-11-14 MED ORDER — DICYCLOMINE HCL 20 MG PO TABS: 20.0000 mg | ORAL_TABLET | Freq: Three times a day (TID) | ORAL | Status: DC

## 2011-11-14 NOTE — Telephone Encounter (Signed)
None of treatments for diarrhea and suspected distal colotis/proctitis have worked.  Still having multiple loose and small volume stools. Bloated.  I have reviewed records, colonoscopy, path notes.  ? IBS rather than colitis  I have prescribed dicyclomine 20 mg qac/hs. To stop cort enema  Advised we would call from office in 1-2 days with follow-up.  Not discussed with her but would consider Lotronex in this patient.

## 2011-11-18 ENCOUNTER — Telehealth: Payer: Self-pay | Admitting: Gastroenterology

## 2011-11-18 NOTE — Telephone Encounter (Signed)
Pt aware and ov scheduled

## 2011-11-18 NOTE — Telephone Encounter (Signed)
Take Imodium 2 tabs every morning and then every 6 hours as needed. Please schedule office visit for next week where I will discuss treatment with Lotronex

## 2011-11-18 NOTE — Telephone Encounter (Signed)
Dr. Leone Payor gave the pt dicyclomine with meals and at bedtime over the weekend. Pt states that it did not help the diarrhea, she thinks it made it worse. Reports having 2 accidents yesterday. Pt wants to know what else she can try. Dr. Arlyce Dice please advise.

## 2011-11-24 ENCOUNTER — Encounter: Payer: Self-pay | Admitting: Gastroenterology

## 2011-11-24 ENCOUNTER — Ambulatory Visit (INDEPENDENT_AMBULATORY_CARE_PROVIDER_SITE_OTHER): Payer: Medicare HMO | Admitting: Gastroenterology

## 2011-11-24 VITALS — BP 132/88 | HR 88 | Ht 63.5 in | Wt 180.2 lb

## 2011-11-24 DIAGNOSIS — K589 Irritable bowel syndrome without diarrhea: Secondary | ICD-10-CM

## 2011-11-24 NOTE — Progress Notes (Signed)
History of Present Illness:  Mrs. Ivanoff complalins of severe diarrhea despite therapy with lialda and ProctoFoam. Imodium slows down diarrhea but causes bloating.  although mild erythema was seen in the left colon, biopsies again were negative for inflammatory changes    Review of Systems: Pertinent positive and negative review of systems were noted in the above HPI section. All other review of systems were otherwise negative.    Current Medications, Allergies, Past Medical History, Past Surgical History, Family History and Social History were reviewed in [employer] electronic medical record  Vital signs were reviewed in today's medical record. Physical Exam: General: Well developed , well nourished, no acute distress

## 2011-11-24 NOTE — Patient Instructions (Addendum)
Research will be speaking with you today 

## 2011-11-24 NOTE — Assessment & Plan Note (Signed)
At this point it appears that she has IBS only. Despite mild changes at colonoscopy there is no biopsy proven evidence for inflammatory bowel disease.  Recommendations #1 patient will consider enrollment in an IBS/diarrhea study

## 2011-11-25 DIAGNOSIS — M1990 Unspecified osteoarthritis, unspecified site: Secondary | ICD-10-CM | POA: Insufficient documentation

## 2011-11-29 ENCOUNTER — Ambulatory Visit: Payer: Self-pay

## 2011-11-30 ENCOUNTER — Ambulatory Visit (INDEPENDENT_AMBULATORY_CARE_PROVIDER_SITE_OTHER): Payer: Self-pay

## 2011-11-30 DIAGNOSIS — R197 Diarrhea, unspecified: Secondary | ICD-10-CM

## 2011-12-08 ENCOUNTER — Ambulatory Visit: Payer: Self-pay

## 2011-12-09 ENCOUNTER — Other Ambulatory Visit: Payer: Self-pay | Admitting: Sports Medicine

## 2011-12-09 DIAGNOSIS — M542 Cervicalgia: Secondary | ICD-10-CM

## 2011-12-10 ENCOUNTER — Ambulatory Visit: Payer: Self-pay

## 2011-12-12 ENCOUNTER — Other Ambulatory Visit: Payer: Medicare HMO

## 2011-12-13 ENCOUNTER — Other Ambulatory Visit (HOSPITAL_COMMUNITY): Payer: Medicare HMO

## 2011-12-14 ENCOUNTER — Other Ambulatory Visit (HOSPITAL_COMMUNITY): Payer: Medicare HMO

## 2011-12-14 ENCOUNTER — Other Ambulatory Visit: Payer: Medicare HMO

## 2011-12-27 ENCOUNTER — Ambulatory Visit (HOSPITAL_COMMUNITY): Payer: Medicare HMO

## 2011-12-28 ENCOUNTER — Ambulatory Visit: Payer: Self-pay

## 2012-02-25 ENCOUNTER — Ambulatory Visit: Payer: Self-pay

## 2012-02-28 ENCOUNTER — Ambulatory Visit (INDEPENDENT_AMBULATORY_CARE_PROVIDER_SITE_OTHER): Payer: Medicare HMO

## 2012-02-28 DIAGNOSIS — R197 Diarrhea, unspecified: Secondary | ICD-10-CM

## 2012-04-17 ENCOUNTER — Telehealth: Payer: Self-pay | Admitting: Gastroenterology

## 2012-04-17 NOTE — Telephone Encounter (Signed)
Pt states she has been having abdominal pain and nausea. Pt scheduled to see Doug Sou PA 04/18/12@10am . Pt aware of appt date and time.

## 2012-04-18 ENCOUNTER — Encounter: Payer: Self-pay | Admitting: Gastroenterology

## 2012-04-18 ENCOUNTER — Other Ambulatory Visit (INDEPENDENT_AMBULATORY_CARE_PROVIDER_SITE_OTHER): Payer: Medicare Other

## 2012-04-18 ENCOUNTER — Ambulatory Visit (INDEPENDENT_AMBULATORY_CARE_PROVIDER_SITE_OTHER): Payer: Medicare Other | Admitting: Gastroenterology

## 2012-04-18 ENCOUNTER — Other Ambulatory Visit: Payer: Medicare Other

## 2012-04-18 VITALS — BP 124/84 | HR 78 | Ht 61.0 in | Wt 173.6 lb

## 2012-04-18 DIAGNOSIS — R1032 Left lower quadrant pain: Secondary | ICD-10-CM | POA: Insufficient documentation

## 2012-04-18 DIAGNOSIS — R110 Nausea: Secondary | ICD-10-CM

## 2012-04-18 LAB — CBC WITH DIFFERENTIAL/PLATELET
Eosinophils Absolute: 0.2 10*3/uL (ref 0.0–0.7)
HCT: 38.8 % (ref 36.0–46.0)
Lymphs Abs: 2.9 10*3/uL (ref 0.7–4.0)
MCHC: 34.7 g/dL (ref 30.0–36.0)
MCV: 94.9 fl (ref 78.0–100.0)
Monocytes Absolute: 0.6 10*3/uL (ref 0.1–1.0)
Neutrophils Relative %: 56.3 % (ref 43.0–77.0)
Platelets: 276 10*3/uL (ref 150.0–400.0)

## 2012-04-18 NOTE — Progress Notes (Signed)
04/18/2012 [name] [id number] [birth year]   History of Present Illness:  Patient is a pleasant 52 year old female who is a patient of Dr. Marzetta Board.  She carries a diagnosis of IBS-D and has been on several different medications and in several different trials for that issue.  Most recent colonoscopy was 10/2011 at which time she was found to have some non-specific proctocolitis, but biopsies were normal.    She presents to our office today with complaints of severe LLQ abdominal pain that began on Saturday.  Says that she has never experienced pain like this in the past; never has complained of pain with her IBS-D, according to her report.  Says that it was associated with nausea and says that she could not eat because even the smell of food made her nauseous.  Felt feverish.  Pain is slightly improved today, but still quite sore and still nauseated.  No blood in stool.  There was no diverticulosis seen on her last colonoscopy report.  Current Medications, Allergies, Past Medical History, Past Surgical History, Family History and Social History were reviewed in [employer] record.   Physical Exam: BP 124/84  Pulse 78  Ht 5\' 1"  (1.549 m)  Wt 173 lb 9.6 oz (78.744 kg)  BMI 32.82 kg/m2  SpO2 98% General: Well developed, white female in no acute distress Head: Normocephalic and atraumatic Eyes:  sclerae anicteric, conjunctiva pink  Ears: Normal auditory acuity Lungs: Clear throughout to auscultation Heart: Regular rate and rhythm Abdomen: Soft, non-distended. No masses, no hepatomegaly. Normal bowel sounds.  Moderate TTP in LLQ and suprapubic region with some voluntary guarding. Rectal: Deferred. Musculoskeletal: Symmetrical with no gross deformities  Extremities: No edema  Neurological: Alert oriented x 4, grossly nonfocal Psychological:  Alert and cooperative. Normal mood and affect  Assessment and Recommendations: -LLQ abdominal pain, acute and associated  with nausea.  Will order CT scan of abdomen and pelvis with contrast for evaluation.  Treatment plan pending those results.  Will check CBC as well.

## 2012-04-18 NOTE — Progress Notes (Signed)
Reviewed and agree with management. Robert D. Kaplan, M.D., FACG  

## 2012-04-18 NOTE — Patient Instructions (Addendum)
You have been scheduled for a CT scan of the abdomen and pelvis at [facility] ([address]---this is in the same building as Architectural technologist).   You are scheduled on 04/18/12 at 3:30pm. You should arrive 15 minutes prior to your appointment time for registration. Please follow the written instructions below on the day of your exam:  WARNING: IF YOU ARE ALLERGIC TO IODINE/X-RAY DYE, PLEASE NOTIFY RADIOLOGY IMMEDIATELY AT [phone]! YOU WILL BE GIVEN A 13 HOUR PREMEDICATION PREP.  1) Do not eat or drink anything after 11:30am (4 hours prior to your test) 2) You have been given 2 bottles of oral contrast to drink. The solution may taste               better if refrigerated, but do NOT add ice or any other liquid to this solution. Shake             well before drinking.    Drink 1 bottle of contrast @ 1:30 pm (2 hours prior to your exam)  Drink 1 bottle of contrast @ 2:30pm (1 hour prior to your exam)  You may take any medications as prescribed with a small amount of water except for the following: Metformin, Glucophage, Glucovance, Avandamet, Riomet, Fortamet, Actoplus Met, Janumet, Glumetza or Metaglip. The above medications must be held the day of the exam AND 48 hours after the exam.  The purpose of you drinking the oral contrast is to aid in the visualization of your intestinal tract. The contrast solution may cause some diarrhea. Before your exam is started, you will be given a small amount of fluid to drink. Depending on your individual set of symptoms, you may also receive an intravenous injection of x-ray contrast/dye. Plan on being at [facility] for 30 minutes or long, depending on the type of exam you are having performed.  This test typically takes 30-45 minutes to complete   If you have any questions regarding your exam or if you need to reschedule, you may call the CT department at [phone] between the hours of 8:00 am and 5:00 pm,  Monday-Friday.  ________________________________________________________________________  Your physician has requested that you go to the basement for the following lab work before leaving today: CBC

## 2012-04-19 ENCOUNTER — Telehealth: Payer: Self-pay

## 2012-04-19 DIAGNOSIS — R110 Nausea: Secondary | ICD-10-CM

## 2012-04-19 DIAGNOSIS — R1032 Left lower quadrant pain: Secondary | ICD-10-CM

## 2012-04-19 MED ORDER — HYOSCYAMINE SULFATE 0.125 MG PO TABS: 0.1250 mg | ORAL_TABLET | Freq: Four times a day (QID) | ORAL | Status: DC | PRN

## 2012-04-19 NOTE — Telephone Encounter (Signed)
Message copied by [place], PATTI E on Wed Apr 19, 2012 11:12 AM ------      Message from: Doug Sou D      Created: Wed Apr 19, 2012 10:50 AM       Well I am glad that she contacted [place] for further instructions!  Let's get a pelvic ultrasound instead.  We can give her an antispasmodic as well.  Levsin 0.125 mg every 6 hours prn.  #30 with no refills.            Thank you, PJ!            ----- Message -----         From: Patti E [place], CMA         Sent: 04/18/2012   4:50 PM           To: Princella Pellegrini. Zehr, PA-C            Patient cancelled her CT today because she found out her insurance would not cover it.  Please advise if any other orders need to be placed.  She said it covers u/s 's.       ------

## 2012-04-19 NOTE — Telephone Encounter (Signed)
Patient informed of U/S appointment at [facility] for 04/20/12 at 9:00am, arrive at 8:45am with a full bladder per Tiffany.  Also informed Alis that we are sending in rx for her, confirmed pharmacy.  Patient informed to have full bladder.

## 2012-04-20 ENCOUNTER — Ambulatory Visit (HOSPITAL_COMMUNITY): Payer: Medicare Other

## 2012-04-25 ENCOUNTER — Other Ambulatory Visit: Payer: Self-pay | Admitting: *Deleted

## 2012-05-05 ENCOUNTER — Other Ambulatory Visit: Payer: Self-pay | Admitting: Obstetrics and Gynecology

## 2012-06-13 ENCOUNTER — Other Ambulatory Visit: Payer: Self-pay

## 2012-06-13 MED ORDER — HYOSCYAMINE SULFATE 0.125 MG PO TABS: 0.1250 mg | ORAL_TABLET | Freq: Four times a day (QID) | ORAL | Status: DC | PRN

## 2012-06-13 NOTE — Telephone Encounter (Signed)
Left message on patients voice mail to call us with a status update.

## 2012-06-14 ENCOUNTER — Telehealth: Payer: Self-pay

## 2012-06-14 NOTE — Telephone Encounter (Signed)
Rasheena called in today to report that the Levsin was helping and that she feels better.  But after talking to Gustine at [place] it is not covered by her insurance.  I informed Candis and she is going to contact her insurance company and see what they will cover and call us back.  She said the reason she didn't get the pelvic U/S was money.  They told her she would have to pay $500.00 up front.  She said she will check her schedule and call back for the [facility] f/u appointment.   For now [facility] is putting the Levsin sent in yesterday on hold.

## 2012-06-14 NOTE — Telephone Encounter (Signed)
Message copied by [place], Darlyn Repsher E on Wed Jun 14, 2012  1:00 PM ------      Message from: Doug Sou D      Created: Tue Jun 13, 2012  2:57 PM       That is fine.  I am guessing that it is helping??  She still has not had her pelvic ultrasound performed.  Is that still scheduled?  She needs follow-up with Arlyce Dice as well.            Thank you,            [name]            ----- Message -----         From: Brasen Bundren E [place], CMA         Sent: 06/13/2012   2:47 PM           To: Princella Pellegrini. Zehr, PA-C            Received fax requesting refill on her generic levsin 0.125mg  , # 30.  Is this ok with you?  Thanks        ------

## 2012-06-20 ENCOUNTER — Telehealth: Payer: Self-pay

## 2012-06-20 MED ORDER — DICYCLOMINE HCL 10 MG PO CAPS: ORAL_CAPSULE | ORAL | Status: DC

## 2012-06-20 NOTE — Telephone Encounter (Signed)
Dicyclomine is fine.  Give her 10 mg every 6 hours prn. #30 with a one refill.  Thank you,  [name]

## 2012-06-20 NOTE — Telephone Encounter (Signed)
Judit spoke to [place] and they say the Levsin is no longer covered.  Please advise another rx to replace it.  I think the generic dicyclomine is on [facility] formulary.

## 2012-06-20 NOTE — Telephone Encounter (Signed)
Dicyclomine rx sent to [facility] and patient informed.

## 2012-06-20 NOTE — Telephone Encounter (Signed)
Spoke to [place] to see if she had talked to her insurance about a substitute for Levsin.  They told her hyoscyamine is covered, gave her # [id number] to give to pharmacy.  I called [facility] and this number did not make it go thru.  The girl at [facility] pharmacy said to have her [employer] call them directly.  [name] is going to try this and let us know.  The drug does not need a prior authorization they are saying it is just not covered.

## 2012-09-28 ENCOUNTER — Telehealth: Payer: Self-pay | Admitting: Gastroenterology

## 2012-09-28 NOTE — Telephone Encounter (Signed)
Left message for pt to call back.  Pt states every 6 mth she gets diarrhea. States she was told to take Imodium but it is not helping. She has tried Armed forces logistics/support/administrative officer also. Pt states Dr. Arlyce Dice told her that if she started having problems again to just call and we could prescribe her something for diarrhea. Dr. Leone Payor you are hospital doc 09/29/12. Please advise.

## 2012-09-28 NOTE — Telephone Encounter (Signed)
I spoke to her and reviewed chart. Loperamide not helping Dicyclomine helps pain but not the diarrhea  She needs Lomotil 1-2 every 6 hrs as needed #90 I told her might be tomorrow for the Rx  I have asked her to let us know early next week if that is helping.  She is going to beach next weekend and wants to avoid diarrhea/incontinence  May need colestipol also and long-term I think she should consider Lotronex for IBS-D

## 2012-09-29 MED ORDER — DIPHENOXYLATE-ATROPINE 2.5-0.025 MG PO TABS: ORAL_TABLET | ORAL | Status: DC

## 2012-09-29 NOTE — Telephone Encounter (Signed)
Pt aware and script sent to the pharmacy. 

## 2012-09-29 NOTE — Telephone Encounter (Signed)
Left message for pt to call back  °

## 2012-10-20 ENCOUNTER — Telehealth: Payer: Self-pay | Admitting: Internal Medicine

## 2012-10-20 NOTE — Telephone Encounter (Signed)
Patient reports that she is having continued diarrhea.  She is scheduled to see Dr. Arlyce Dice on 11/10/12

## 2012-11-10 ENCOUNTER — Ambulatory Visit: Payer: Medicare Other | Admitting: Gastroenterology

## 2012-11-10 ENCOUNTER — Telehealth: Payer: Self-pay | Admitting: Gastroenterology

## 2012-11-10 MED ORDER — CHOLESTYRAMINE 4 GM/DOSE PO POWD: ORAL | Status: DC

## 2012-11-10 NOTE — Telephone Encounter (Signed)
Cholestyramine one packet one to 4 times a day

## 2012-11-10 NOTE — Telephone Encounter (Signed)
Script sent to pharmacy and pt aware. Pt to call back with an update. Dose of 4gm up to 4 times daily clarified with Dr. Arlyce Dice.

## 2012-11-10 NOTE — Telephone Encounter (Signed)
Pt states she was told by Dr. Arlyce Dice to just call and let us know if she was still having problems, states she does not have the money to pay the copays. Pt states the lomotil is not helping. Dr. Arlyce Dice would Lanetta Inch be an option for her? Please advise.

## 2012-11-22 ENCOUNTER — Telehealth: Payer: Self-pay | Admitting: Gastroenterology

## 2012-11-22 NOTE — Telephone Encounter (Signed)
Pt states that the Questran is not working for the diarrhea. Also reported the lomotil did not help. Please advise.

## 2012-11-23 NOTE — Telephone Encounter (Signed)
Begin lotronex 0.5mg  bid.  Please send her the warnings about lotronex.  Hold if constipated. OV 2-3 weeks

## 2012-11-24 NOTE — Telephone Encounter (Signed)
Left message for pt to call back  °

## 2012-11-27 ENCOUNTER — Telehealth: Payer: Self-pay | Admitting: Gastroenterology

## 2012-11-27 MED ORDER — ALOSETRON HCL 0.5 MG PO TABS: 0.5000 mg | ORAL_TABLET | Freq: Two times a day (BID) | ORAL | Status: DC

## 2012-11-27 NOTE — Telephone Encounter (Signed)
Pt called back and she no longer has diarrhea. Lotonex cancelled. See other phone note.

## 2012-11-27 NOTE — Addendum Note (Signed)
Addended by: Selinda Michaels R on: 11/27/2012 11:50 AM   Modules accepted: Orders, Medications

## 2012-11-27 NOTE — Telephone Encounter (Signed)
Pt aware and paperwork signed. Script printed out for Dr. Arlyce Dice to sign. OV scheduled and pt aware.

## 2012-11-27 NOTE — Telephone Encounter (Signed)
Pt states that she no longer has diarrhea so she does not think she needs the lotronex that we were going to start her on. Pt states that when she wipes it is like she does not have a complete BM and she has to wear a pad and she cannot seem to get clean. Pt schedule to see Dr. Arlyce Dice Thursday at 10:30am. Pt aware of appt.

## 2012-11-30 ENCOUNTER — Ambulatory Visit: Payer: Medicare Other | Admitting: Gastroenterology

## 2012-12-15 ENCOUNTER — Ambulatory Visit (INDEPENDENT_AMBULATORY_CARE_PROVIDER_SITE_OTHER): Payer: Medicare Other | Admitting: Gastroenterology

## 2012-12-15 ENCOUNTER — Other Ambulatory Visit (INDEPENDENT_AMBULATORY_CARE_PROVIDER_SITE_OTHER): Payer: Medicare Other

## 2012-12-15 ENCOUNTER — Ambulatory Visit: Payer: Medicare Other | Admitting: Gastroenterology

## 2012-12-15 ENCOUNTER — Encounter: Payer: Self-pay | Admitting: Gastroenterology

## 2012-12-15 VITALS — BP 116/72 | HR 68 | Ht 63.0 in | Wt 171.6 lb

## 2012-12-15 DIAGNOSIS — R110 Nausea: Secondary | ICD-10-CM

## 2012-12-15 DIAGNOSIS — R109 Unspecified abdominal pain: Secondary | ICD-10-CM

## 2012-12-15 DIAGNOSIS — R197 Diarrhea, unspecified: Secondary | ICD-10-CM

## 2012-12-15 MED ORDER — LORAZEPAM 1 MG PO TABS: ORAL_TABLET | ORAL | Status: DC

## 2012-12-15 NOTE — Progress Notes (Addendum)
12/15/2012 [name] [id number] [birth year]   History of Present Illness:  This is a 52 year old female who is known to Dr. Arlyce Dice. She carries a diagnosis of IBS-D and has been on several medications and in several different trials for that same issue. She has taken Lomotil, Imodium, Questran, and Bentyl most recently. Her most recent colonoscopy was in September 2013 at which time she was found to have some nonspecific proctocolitis, but biopsies were normal.  She presented to our office in March of this year at which time I saw her for complaints of severe left lower quadrant abdominal pain, which was associated with nausea. I had ordered a CT scan at that time, however, patient canceled that study because she did not think that her insurance would pay for it. We then ordered an ultrasound of the pelvis, which she also canceled because she could not afford it. Since that time there have been multiple phone calls back and forth treating her diarrhea with several different medications. Most recently she was offered Lotronex, however, she tells me today that after her and her husband saw the side effects that they were scared to have her start taking the medication, which is understandable without adequate explanation. Right now she is taking dicyclomine 10 mg as needed and Lomotil as needed. She says that her diarrhea has not necessarily been as bad recently, but she comes in today complaining again of abdominal pain associated with nausea. These pains are described as cramping in the lower abdomen and are very severe reaching 10 out of 10 on the pain scale at times. They are most of the time relieved after she has a bowel movement.  They are occuring usually twice a week.  Denies seeing blood in the stool.   Current Medications, Allergies, Past Medical History, Past Surgical History, Family History and Social History were reviewed in [employer] record.   Physical Exam: BP  116/72  Pulse 68  Ht 5\' 3"  (1.6 m)  Wt 171 lb 9.6 oz (77.837 kg)  BMI 30.41 kg/m2 General: Well developed, white female in no acute distress Head: Normocephalic and atraumatic Eyes:  Sclerae anicteric, conjunctiva pink  Ears: Normal auditory acuity Lungs: Clear throughout to auscultation Heart: Regular rate and rhythm Abdomen: Soft, non-distended.  BS present.  LLQ and suprapubic TTP without R/R/G. Musculoskeletal: Symmetrical with no gross deformities  Extremities: No edema.  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  Neurological: Alert oriented x 4, grossly nonfocal Psychological:  Alert and cooperative. Normal mood and affect.  Has somewhat pressured speech.  Assessment and Recommendations: -LLQ abdominal pain associated with nausea.  Also has chronic diarrhea.  It very well may be that her symptoms are all secondary to her IBS, however, if we are going to consider treating her with Lotronex then I think that we should proceed with a CT scan as previously discussed. Will order CT scan of abdomen and pelvis with contrast for evaluation and we will check on coverage of that prior to her scheduled date. We will give her 1 mg of Ativan to take 30 minutes prior to the procedure due to her severe claustrophobia. I am also going to check labs for celiac disease to rule that out. We will follow up the results of the CT scan and bring her back to discuss Lotronex if those results are negative and we decide to proceed with that treatment.

## 2012-12-15 NOTE — Patient Instructions (Addendum)
Please go to the basement level to have your labs drawn.  We have given you a prescription for Ativan 1 mg. Take 1 tab 30 minutes before the CT scan.   You have been scheduled for a CT scan of the abdomen and pelvis at [facility] ([address]---this is in the same building as Architectural technologist).   You are scheduled on Wednesday 11-12 at 1:30 PM . You should arrive 15 minutes prior to your appointment time for registration. Please follow the written instructions below on the day of your exam:  WARNING: IF YOU ARE ALLERGIC TO IODINE/X-RAY DYE, PLEASE NOTIFY RADIOLOGY IMMEDIATELY AT [phone]! YOU WILL BE GIVEN A 13 HOUR PREMEDICATION PREP.  1) Do not eat or drink anything after 9:30 am (4 hours prior to your test) 2) You have been given 2 bottles of oral contrast to drink. The solution may taste better if refrigerated, but do NOT add ice or any other liquid to this solution. Shake well before drinking.    Drink 1 bottle of contrast @  11:30 am  (2 hours prior to your exam)   Drink 1 bottle of contrast @ 12:30 PM  (1 hour prior to your exam)  You may take any medications as prescribed with a small amount of water except for the following: Metformin, Glucophage, Glucovance, Avandamet, Riomet, Fortamet, Actoplus Met, Janumet, Glumetza or Metaglip. The above medications must be held the day of the exam AND 48 hours after the exam.  The purpose of you drinking the oral contrast is to aid in the visualization of your intestinal tract. The contrast solution may cause some diarrhea. Before your exam is started, you will be given a small amount of fluid to drink. Depending on your individual set of symptoms, you may also receive an intravenous injection of x-ray contrast/dye. Plan on being at [facility] for 30 minutes or long, depending on the type of exam you are having performed.  If you have any questions regarding your exam or if you need to reschedule, you may call the CT  department at [phone] between the hours of 8:00 am and 5:00 pm, Monday-Friday.  ________________________________________________________________________

## 2012-12-18 LAB — T-TRANSGLUTAMINASE (TTG) IGG: Tissue Transglut Ab: <2 U/mL (ref 0–5)

## 2012-12-18 NOTE — Progress Notes (Signed)
Reviewed and agree with management. Melayna Robarts D. Clariece Roesler, M.D., FACG  

## 2012-12-19 ENCOUNTER — Telehealth: Payer: Self-pay | Admitting: Gastroenterology

## 2012-12-19 NOTE — Telephone Encounter (Signed)
Bonita Quin I think you was working on this patient

## 2012-12-19 NOTE — Telephone Encounter (Signed)
Pt called for Lotronex script. Pt was seen by Doug Sou PA and scheduled for a CT scan to evaluate the abdominal pain. Pt cancelled the CT scan, this is the second time she cancelled a CT scan for abdominal pain. Per Shanda Bumps pt may not have Lotronex script until CT is done and abdominal pain has been evaluated. Left message for pt to call back.  Spoke with pt and she states that she ran into Dr. Arlyce Dice over the weekend and he told her to call for the Lotronex script. Discussed with pt that it was felt she should have the pain evaluated prior to starting on Lotronex. Pt scheduled to see Dr. Arlyce Dice tomorrow, she is aware of appt.

## 2012-12-20 ENCOUNTER — Other Ambulatory Visit: Payer: Medicare Other

## 2012-12-20 ENCOUNTER — Ambulatory Visit: Payer: Medicare Other | Admitting: Gastroenterology

## 2012-12-27 ENCOUNTER — Ambulatory Visit (INDEPENDENT_AMBULATORY_CARE_PROVIDER_SITE_OTHER): Payer: Medicare Other | Admitting: Gastroenterology

## 2012-12-27 ENCOUNTER — Encounter: Payer: Self-pay | Admitting: Gastroenterology

## 2012-12-27 VITALS — BP 116/72 | HR 80 | Ht 63.0 in | Wt 172.0 lb

## 2012-12-27 DIAGNOSIS — K589 Irritable bowel syndrome without diarrhea: Secondary | ICD-10-CM

## 2012-12-27 MED ORDER — ALOSETRON HCL 0.5 MG PO TABS: 0.5000 mg | ORAL_TABLET | Freq: Two times a day (BID) | ORAL | Status: DC

## 2012-12-27 NOTE — Progress Notes (Signed)
History of Present Illness:  The patient has returned for followup of diarrhea and abdominal pain.  Abdominal pain has subsided.  This was in the setting of constipation.  She's now back to her baseline diarrhea.  CT scan was ordered but was never done.  She has frequent postprandial diarrhea with severe urgency.    Review of Systems: Pertinent positive and negative review of systems were noted in the above HPI section. All other review of systems were otherwise negative.    Current Medications, Allergies, Past Medical History, Past Surgical History, Family History and Social History were reviewed in [employer] electronic medical record  Vital signs were reviewed in today's medical record. Physical Exam: General: Well developed , well nourished, no acute distress

## 2012-12-27 NOTE — Assessment & Plan Note (Signed)
Therapy with Lotronex was discussed in detail including a higher risk for ischemic colitis.  Patient will read over the literature, sign a consent form and begin therapy at 0.5 mg twice a day.  She was carefully instructed to contact the office and stop this medicine if she develops pain or constipation.

## 2013-01-02 ENCOUNTER — Telehealth: Payer: Self-pay | Admitting: Gastroenterology

## 2013-01-02 NOTE — Telephone Encounter (Signed)
L/M that she was given a printed script while she was in the office  with a sticker on it. Explained to her if she lost her script that she would need to come pick up another script

## 2013-01-08 ENCOUNTER — Telehealth: Payer: Self-pay | Admitting: Gastroenterology

## 2013-01-08 DIAGNOSIS — K589 Irritable bowel syndrome without diarrhea: Secondary | ICD-10-CM

## 2013-01-10 ENCOUNTER — Encounter: Payer: Self-pay | Admitting: *Deleted

## 2013-01-10 ENCOUNTER — Telehealth: Payer: Self-pay | Admitting: Gastroenterology

## 2013-01-10 NOTE — Telephone Encounter (Signed)
Check with the drug company to see whether they can supply samples

## 2013-01-10 NOTE — Telephone Encounter (Signed)
Pharmacy contacted Lotronex approved

## 2013-01-10 NOTE — Telephone Encounter (Signed)
Working on this

## 2013-01-10 NOTE — Telephone Encounter (Signed)
I got Lotronex approved through this patients insurance and she says itsz to expensive what do you want to give her

## 2013-01-10 NOTE — Telephone Encounter (Signed)
Trying to get Lotrenex approved

## 2013-01-10 NOTE — Telephone Encounter (Signed)
Approved.  

## 2013-01-16 NOTE — Telephone Encounter (Signed)
Spoke with patient  Told her I would try to get her free samples from the drug company

## 2013-01-19 NOTE — Telephone Encounter (Signed)
Researching

## 2013-01-19 NOTE — Telephone Encounter (Signed)
Called [employer]  They do not give out samples for the patients but they give a drug card to patients with reg insurance it pays the first 15$ and the card covers up to 500$  They are over nighting the cards to me. They are sending 10 cards  Attention to me Called patient and left a message

## 2013-01-22 ENCOUNTER — Telehealth: Payer: Self-pay | Admitting: *Deleted

## 2013-01-22 NOTE — Telephone Encounter (Signed)
Message copied by Marlowe Kays on Mon Jan 22, 2013  3:02 PM ------      Message from: Mckinley Jewel, AMY L      Created: Mon Jan 22, 2013  1:33 PM       Returning your call.  [phone]            Thanks      Amy ------

## 2013-01-22 NOTE — Telephone Encounter (Signed)
Dr Waneta Martins does not supply samples of Lotronex I called the company all they have is drug savings cards which only works with [employer] The patient has Medicare/Medicaid  The patient is very frustrated and wants to be prescribed something else. She can not afford 700$ a month for this medication

## 2013-01-22 NOTE — Telephone Encounter (Signed)
Message copied by Marlowe Kays on Mon Jan 22, 2013  2:58 PM ------      Message from: Mckinley Jewel, AMY L      Created: Mon Jan 22, 2013  1:33 PM       Returning your call.  [phone]            Thanks      Amy ------

## 2013-01-23 NOTE — Telephone Encounter (Signed)
Go back to taking immodium 2 tabs qam and 2 tabs qpm. C/b next week

## 2013-01-24 NOTE — Telephone Encounter (Signed)
Explained to pt to try Imodium again and call us back in one week

## 2013-01-28 ENCOUNTER — Other Ambulatory Visit: Payer: Self-pay | Admitting: Internal Medicine

## 2013-01-29 NOTE — Telephone Encounter (Signed)
Looks like Dr. Arlyce Dice saw this patient recently.

## 2013-02-05 ENCOUNTER — Telehealth: Payer: Self-pay | Admitting: Gastroenterology

## 2013-02-05 NOTE — Telephone Encounter (Signed)
Patient did not qualify for the lotronex assistance program and she is not able to afford it.  OTC imodium does not help her diarrhea at all.  What is the next step.  She is aware that Dr. Arlyce Dice is not in the office this week and will most likely be next week before he replies

## 2013-02-12 ENCOUNTER — Ambulatory Visit: Payer: Medicare Other | Admitting: Gastroenterology

## 2013-02-12 ENCOUNTER — Telehealth: Payer: Self-pay

## 2013-02-12 NOTE — Telephone Encounter (Signed)
Spoke with pt and she is aware. States she will talk to her husband and call us back if she decides to schedule the procedure.

## 2013-02-12 NOTE — Telephone Encounter (Signed)
She has been refractory to medical therapy.  I would proceed in the following way: #1 schedule upper endoscopy with small bowel biopsies #2 if biopsies are negative I will refer her to nutrition for a low FODMAP diet

## 2013-02-12 NOTE — Telephone Encounter (Signed)
Pt called back and states she would like to go ahead and schedule the EGD. Pt scheduled for previsit and EGD. Pt aware of appts.

## 2013-02-13 ENCOUNTER — Ambulatory Visit (AMBULATORY_SURGERY_CENTER): Payer: Medicare Other

## 2013-02-13 VITALS — Ht 61.0 in | Wt 170.0 lb

## 2013-02-13 DIAGNOSIS — K589 Irritable bowel syndrome without diarrhea: Secondary | ICD-10-CM

## 2013-02-14 ENCOUNTER — Encounter: Payer: Self-pay | Admitting: Gastroenterology

## 2013-02-23 ENCOUNTER — Ambulatory Visit (AMBULATORY_SURGERY_CENTER): Payer: Medicare Other | Admitting: Gastroenterology

## 2013-02-23 ENCOUNTER — Encounter: Payer: Self-pay | Admitting: Gastroenterology

## 2013-02-23 VITALS — BP 116/71 | HR 78 | Temp 97.6°F | Resp 22 | Ht 61.0 in | Wt 170.0 lb

## 2013-02-23 DIAGNOSIS — D1330 Benign neoplasm of unspecified part of small intestine: Secondary | ICD-10-CM

## 2013-02-23 DIAGNOSIS — K589 Irritable bowel syndrome without diarrhea: Secondary | ICD-10-CM

## 2013-02-23 MED ORDER — SODIUM CHLORIDE 0.9 % IV SOLN: 500.0000 mL | INTRAVENOUS | Status: DC

## 2013-02-23 NOTE — Patient Instructions (Signed)

## 2013-02-23 NOTE — Progress Notes (Signed)
Called to room to assist during endoscopic procedure.  Patient ID and intended procedure confirmed with present staff. Received instructions for my participation in the procedure from the performing physician. ewm 

## 2013-02-23 NOTE — Progress Notes (Signed)
Procedure ends, to recovery, report given and VSS. 

## 2013-02-23 NOTE — Op Note (Signed)
[place]  [address] [place], [id number]   ENDOSCOPY PROCEDURE REPORT  PATIENT: Tina, [name]  MR#: [id number] BIRTHDATE: 03/04/1960 , 59  yrs. old GENDER: Female ENDOSCOPIST: Inda Castle, MD REFERRED BY:  Mali Badger, M.D. PROCEDURE DATE:  02/23/2013 PROCEDURE:  EGD w/ biopsy ASA CLASS:     Class II INDICATIONS:  Chronic diarrhea. MEDICATIONS: MAC sedation, administered by CRNA, propofol (Diprivan) 200mg  IV, and Simethicone 0.6cc PO TOPICAL ANESTHETIC:  DESCRIPTION OF PROCEDURE: After the risks benefits and alternatives of the procedure were thoroughly explained, informed consent was obtained.  The LB [id number] endoscope was introduced through the mouth and advanced to the third portion of the duodenum. Without limitations.  The instrument was slowly withdrawn as the mucosa was fully examined.      The upper, middle and distal third of the esophagus were carefully inspected and no abnormalities were noted.  The z-line was well seen at the GEJ.  The endoscope was pushed into the fundus which was normal including a retroflexed view.  The antrum, gastric body, first and second part of the duodenum were unremarkable. Random biopsies were taken in the bulb, second and third portions of the duodenum to rule out celiac sprue. Retroflexed views revealed no abnormalities.     The scope was then withdrawn from the patient and the procedure completed.  COMPLICATIONS: There were no complications. ENDOSCOPIC IMPRESSION: Normal EGD  RECOMMENDATIONS: Await pathology results  REPEAT EXAM:  eSigned:  Inda Castle, MD 02/23/2013 11:34 AM   CC:

## 2013-02-26 ENCOUNTER — Telehealth: Payer: Self-pay | Admitting: *Deleted

## 2013-02-26 NOTE — Telephone Encounter (Signed)
  Follow up Call-  Call back number 02/23/2013  Post procedure Call Back phone  # [phone]  Permission to leave phone message Yes     Patient questions:  Do you have a fever, pain , or abdominal swelling? no Pain Score  0 *  Have you tolerated food without any problems? yes  Have you been able to return to your normal activities? yes  Do you have any questions about your discharge instructions: Diet   no Medications  no Follow up visit  no  Do you have questions or concerns about your Care? no  Actions: * If pain score is 4 or above: No action needed, pain <4.

## 2013-02-27 ENCOUNTER — Encounter: Payer: Self-pay | Admitting: Gastroenterology

## 2013-03-01 ENCOUNTER — Telehealth: Payer: Self-pay | Admitting: Gastroenterology

## 2013-03-01 MED ORDER — DIPHENOXYLATE-ATROPINE 2.5-0.025 MG PO TABS: ORAL_TABLET | ORAL | Status: DC

## 2013-03-01 NOTE — Telephone Encounter (Signed)
2 tabs q6h prn if no abdominal distention

## 2013-03-01 NOTE — Telephone Encounter (Signed)
Patient given recommendations. Rx sent to pharmacy. 

## 2013-03-01 NOTE — Telephone Encounter (Signed)
Patient is asking for rx for Lomotil. States she takes it prn for diarrhea. Please, advise.

## 2013-03-06 ENCOUNTER — Other Ambulatory Visit: Payer: Self-pay

## 2013-03-06 DIAGNOSIS — R197 Diarrhea, unspecified: Secondary | ICD-10-CM

## 2013-03-12 ENCOUNTER — Telehealth: Payer: Self-pay | Admitting: Gastroenterology

## 2013-03-12 DIAGNOSIS — R197 Diarrhea, unspecified: Secondary | ICD-10-CM

## 2013-03-12 MED ORDER — RIFAXIMIN 200 MG PO TABS: 200.0000 mg | ORAL_TABLET | Freq: Three times a day (TID) | ORAL | Status: DC

## 2013-03-12 NOTE — Telephone Encounter (Signed)
Let's obtain a stool pathogen panel then empirically place her on xifaxan 200mg  tid for 1 week, then c/b

## 2013-03-12 NOTE — Telephone Encounter (Signed)
Pt aware. Order in epic and script sent to the pharmacy.

## 2013-03-12 NOTE — Telephone Encounter (Signed)
Pt states the lomotil that Dr. [name] prescribed for her is not working. States she is still having diarrhea, 2-3 times a day. She states she is taking 2 tablets in the am and 2 in the pm. Reports she has a hard time leaving the house. Please advise.

## 2013-03-13 ENCOUNTER — Telehealth: Payer: Self-pay | Admitting: Gastroenterology

## 2013-03-13 ENCOUNTER — Other Ambulatory Visit: Payer: Medicare Other

## 2013-03-13 ENCOUNTER — Other Ambulatory Visit: Payer: Self-pay

## 2013-03-13 DIAGNOSIS — R197 Diarrhea, unspecified: Secondary | ICD-10-CM

## 2013-03-13 NOTE — Telephone Encounter (Signed)
Pt states she cannot afford the Xifaxan, states it was going to cost her 300 dollars for 7 days. Pt wants to know if there is something else she can take. Please advise.

## 2013-03-13 NOTE — Telephone Encounter (Signed)
Dr [name] Please advise  Left patient a message that I would find out and call her back after Dr [name] responds

## 2013-03-14 LAB — GASTROINTESTINAL PATHOGEN PANEL PCR
C. difficile Tox A/B, PCR: NEGATIVE
Campylobacter, PCR: NEGATIVE
Cryptosporidium, PCR: NEGATIVE
E COLI (STEC) STX1/STX2, PCR: NEGATIVE
E coli (ETEC) LT/ST PCR: NEGATIVE
E coli 0157, PCR: NEGATIVE
GIARDIA LAMBLIA, PCR: NEGATIVE
NOROVIRUS, PCR: NEGATIVE
Rotavirus A, PCR: NEGATIVE
Salmonella, PCR: NEGATIVE
Shigella, PCR: NEGATIVE

## 2013-03-14 MED ORDER — CIPROFLOXACIN HCL 500 MG PO TABS: 500.0000 mg | ORAL_TABLET | Freq: Two times a day (BID) | ORAL | Status: DC

## 2013-03-14 MED ORDER — METRONIDAZOLE 250 MG PO TABS: 250.0000 mg | ORAL_TABLET | Freq: Three times a day (TID) | ORAL | Status: DC

## 2013-03-14 NOTE — Telephone Encounter (Signed)
Pt aware and scripts sent to pharmacy. 

## 2013-03-14 NOTE — Telephone Encounter (Signed)
Flagyl 250mg  tid, cipro 500mg  bid times 7 days

## 2013-06-18 ENCOUNTER — Encounter (HOSPITAL_COMMUNITY): Payer: Self-pay | Admitting: Emergency Medicine

## 2013-06-18 ENCOUNTER — Telehealth: Payer: Self-pay | Admitting: Gastroenterology

## 2013-06-18 ENCOUNTER — Other Ambulatory Visit: Payer: Self-pay | Admitting: Gastroenterology

## 2013-06-18 ENCOUNTER — Emergency Department (HOSPITAL_COMMUNITY)
Admission: EM | Admit: 2013-06-18 | Discharge: 2013-06-18 | Disposition: A | Discharge status: Home / Self Care | Payer: Medicare Other | Attending: Emergency Medicine | Admitting: Emergency Medicine

## 2013-06-18 DIAGNOSIS — K625 Hemorrhage of anus and rectum: Secondary | ICD-10-CM | POA: Insufficient documentation

## 2013-06-18 DIAGNOSIS — I10 Essential (primary) hypertension: Secondary | ICD-10-CM | POA: Insufficient documentation

## 2013-06-18 DIAGNOSIS — R197 Diarrhea, unspecified: Secondary | ICD-10-CM | POA: Insufficient documentation

## 2013-06-18 DIAGNOSIS — Z8739 Personal history of other diseases of the musculoskeletal system and connective tissue: Secondary | ICD-10-CM | POA: Insufficient documentation

## 2013-06-18 DIAGNOSIS — Z79899 Other long term (current) drug therapy: Secondary | ICD-10-CM | POA: Insufficient documentation

## 2013-06-18 DIAGNOSIS — Z90710 Acquired absence of both cervix and uterus: Secondary | ICD-10-CM | POA: Insufficient documentation

## 2013-06-18 DIAGNOSIS — F3289 Other specified depressive episodes: Secondary | ICD-10-CM | POA: Insufficient documentation

## 2013-06-18 DIAGNOSIS — F329 Major depressive disorder, single episode, unspecified: Secondary | ICD-10-CM | POA: Insufficient documentation

## 2013-06-18 DIAGNOSIS — K589 Irritable bowel syndrome without diarrhea: Secondary | ICD-10-CM | POA: Insufficient documentation

## 2013-06-18 DIAGNOSIS — G43909 Migraine, unspecified, not intractable, without status migrainosus: Secondary | ICD-10-CM | POA: Insufficient documentation

## 2013-06-18 DIAGNOSIS — Z9889 Other specified postprocedural states: Secondary | ICD-10-CM | POA: Insufficient documentation

## 2013-06-18 DIAGNOSIS — Z8639 Personal history of other endocrine, nutritional and metabolic disease: Secondary | ICD-10-CM | POA: Insufficient documentation

## 2013-06-18 DIAGNOSIS — Z862 Personal history of diseases of the blood and blood-forming organs and certain disorders involving the immune mechanism: Secondary | ICD-10-CM | POA: Insufficient documentation

## 2013-06-18 DIAGNOSIS — R109 Unspecified abdominal pain: Secondary | ICD-10-CM | POA: Insufficient documentation

## 2013-06-18 LAB — COMPREHENSIVE METABOLIC PANEL
ALK PHOS: 90 U/L (ref 39–117)
ALT: 31 U/L (ref 0–35)
AST: 20 U/L (ref 0–37)
Albumin: 3.9 g/dL (ref 3.5–5.2)
BUN: 24 mg/dL — ABNORMAL HIGH (ref 6–23)
CHLORIDE: 99 meq/L (ref 96–112)
CO2: 31 meq/L (ref 19–32)
Calcium: 9.6 mg/dL (ref 8.4–10.5)
Creatinine, Ser: 0.7 mg/dL (ref 0.50–1.10)
GFR calc Af Amer: >90 mL/min (ref >90)
GLUCOSE: 139 mg/dL — AB (ref 70–99)
POTASSIUM: 3.9 meq/L (ref 3.7–5.3)
SODIUM: 143 meq/L (ref 137–147)
Total Bilirubin: 0.7 mg/dL (ref 0.3–1.2)
Total Protein: 7.5 g/dL (ref 6.0–8.3)

## 2013-06-18 LAB — CBC
HCT: 41.7 % (ref 36.0–46.0)
Hemoglobin: 14.9 g/dL (ref 12.0–15.0)
MCH: 33.8 pg (ref 26.0–34.0)
MCHC: 35.7 g/dL (ref 30.0–36.0)
MCV: 94.6 fL (ref 78.0–100.0)
PLATELETS: 283 10*3/uL (ref 150–400)
RBC: 4.41 MIL/uL (ref 3.87–5.11)
RDW: 12.8 % (ref 11.5–15.5)
WBC: 10.2 10*3/uL (ref 4.0–10.5)

## 2013-06-18 LAB — ABO/RH: ABO/RH(D): O POS

## 2013-06-18 LAB — TYPE AND SCREEN
ABO/RH(D): O POS
Antibody Screen: NEGATIVE

## 2013-06-18 NOTE — Telephone Encounter (Signed)
I really need to see the patient before we refill Flagyl

## 2013-06-18 NOTE — ED Notes (Signed)
Pt states that she started having diarrhea last night and now she is having rectal bleeding. Nausea. States she feels weak. No hx of same. Alert and oriented. Ambulatory.

## 2013-06-18 NOTE — Discharge Instructions (Signed)
Bloody Stools Bloody stools means there is blood in your poop (stool). It is a sign that there is a problem somewhere in the digestive system. It is important for your doctor to find the cause of your bleeding, so the problem can be treated.  HOME CARE  Only take medicine as told by your doctor.  Eat foods with fiber (prunes, bran cereals).  Drink enough fluids to keep your pee (urine) clear or pale yellow.  Sit in warm water (sitz bath) for 10 to 15 minutes as told by your doctor.  Know how to take your medicines (enemas, suppositories) if advised by your doctor.  Watch for signs that you are getting better or getting worse. GET HELP RIGHT AWAY IF:   You are not getting better.  You start to get better but then get worse again.  You have new problems.  You have severe bleeding from the place where poop comes out (rectum) that does not stop.  You throw up (vomit) blood.  You feel weak or pass out (faint).  You have a fever.  You develop constant or severe abdominal pain, black stools, or other concerns. MAKE SURE YOU:   Understand these instructions.  Will watch your condition.  Will get help right away if you are not doing well or get worse. Document Released: 01/13/2009 Document Revised: 04/19/2011 Document Reviewed: 06/12/2010 [facility] Patient Information 2014 [place]. Abdominal (belly) pain can be caused by many things. Your caregiver performed an examination and possibly ordered blood/urine tests and imaging (CT scan, x-rays, ultrasound). Many cases can be observed and treated at home after initial evaluation in the emergency department. Even though you are being discharged home, abdominal pain can be unpredictable. Therefore, you need a repeated exam if your pain does not resolve, returns, or worsens. Most patients with abdominal pain don't have to be admitted to the hospital or have surgery, but serious problems like appendicitis and gallbladder attacks can  start out as nonspecific pain. Many abdominal conditions cannot be diagnosed in one visit, so follow-up evaluations are very important. SEEK IMMEDIATE MEDICAL ATTENTION IF: The pain does not go away or becomes severe.  A temperature above 101 develops.  Repeated vomiting occurs (multiple episodes).  The pain becomes localized to portions of the abdomen. The right side could possibly be appendicitis. In an adult, the left lower portion of the abdomen could be colitis or diverticulitis.  Blood is being passed in stools or vomit (bright red or black tarry stools).  Return also if you develop chest pain, difficulty breathing, dizziness or fainting, or become confused, poorly responsive, or inconsolable (young children).
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
## 2013-06-18 NOTE — Telephone Encounter (Signed)
Pt called c/o rectal bleeding, called back and said she was going to [facility] ER.  Nurse informed.

## 2013-06-18 NOTE — Telephone Encounter (Signed)
Left message for pt to call back  °

## 2013-06-18 NOTE — Telephone Encounter (Signed)
Dr [name], This patient wants a refill of lfagyl  Can she have it or does she need to be seen

## 2013-06-19 ENCOUNTER — Encounter: Payer: Self-pay | Admitting: *Deleted

## 2013-06-19 NOTE — Telephone Encounter (Signed)
Tried twice to contact patient. She needs an office appointment  before refills of Flagyl

## 2013-06-20 ENCOUNTER — Ambulatory Visit (INDEPENDENT_AMBULATORY_CARE_PROVIDER_SITE_OTHER): Payer: Medicare Other | Admitting: Gastroenterology

## 2013-06-20 ENCOUNTER — Encounter: Payer: Self-pay | Admitting: Gastroenterology

## 2013-06-20 VITALS — BP 120/70 | HR 80 | Ht 61.0 in | Wt 173.6 lb

## 2013-06-20 DIAGNOSIS — K625 Hemorrhage of anus and rectum: Secondary | ICD-10-CM | POA: Insufficient documentation

## 2013-06-20 DIAGNOSIS — K589 Irritable bowel syndrome without diarrhea: Secondary | ICD-10-CM

## 2013-06-20 DIAGNOSIS — R197 Diarrhea, unspecified: Secondary | ICD-10-CM

## 2013-06-20 MED ORDER — DIPHENOXYLATE-ATROPINE 2.5-0.025 MG PO TABS: 1.0000 | ORAL_TABLET | Freq: Two times a day (BID) | ORAL | Status: DC

## 2013-06-20 MED ORDER — METRONIDAZOLE 250 MG PO TABS: 250.0000 mg | ORAL_TABLET | Freq: Three times a day (TID) | ORAL | Status: DC

## 2013-06-20 NOTE — Progress Notes (Signed)
06/20/2013 KORTLYNN POUST [id number] 09/07/1960   History of Present Illness:  This is a 53 year old female who is known to Dr. [name] for treatment of her severe IBS-D and has been on several medications for this issue.  Her most recent colonoscopy was in September 2013 at which time she was found to have some nonspecific proctocolitis, but biopsies were normal.  Other extensive evaluation has been unrevealing.  She cannot afford to try lotronex or Xifaxan.  Most recently she was given a prescription of flagyl 250 mg TID to take for a course of 7 days at a time when her symptoms seen to flair.  She has taken two courses of that since February and thinks that it helps for a limited time.  She is asking for a refill of the medication because she knows that Dr. [name] has been running out of options for treatment of her issue.  She says that she still has severe diarrhea at times with incontinence.  It controls her life and limits her ability to go places.    She also was in the ED on Monday, 5/11 due to rectal bleeding.  She had been having a lot of diarrhea and started seeing bright red blood with it.       The ED note reported "Chaperone present for anoscopy revealing a tiny amount of bright red blood only one to 2 drops in the rectal vault, with no bleeding site noted, no tenderness, and patient tolerated procedure well with no apparent immediate complications".  Now, the bleeding has almost resolved at this point with only a small amount of pink color in her BM's today.    Current Medications, Allergies, Past Medical History, Past Surgical History, Family History and Social History were reviewed in [employer] record.   Physical Exam: BP 120/70  Pulse 80  Ht 5\' 1"  (1.549 m)  Wt 173 lb 9.6 oz (78.744 kg)  BMI 32.82 kg/m2 General: Well developed white female in no acute distress Head: Normocephalic and atraumatic Eyes:  Sclerae anicteric, conjunctiva pink  Ears:  Normal auditory acuity Lungs: Clear throughout to auscultation Heart: Regular rate and rhythm Abdomen: Soft, non-distended.  Normal bowel sounds.  Non-tender. Musculoskeletal: Symmetrical with no gross deformities  Extremities: No edema  Neurological: Alert oriented x 4, grossly non-focal Psychological:  Alert and cooperative. Normal mood and affect  Assessment and Recommendations: -Severe IBS-D:  Has been on several medications in the past for this issue without much or any long-standing relief.  Cannot afford lotronex or Xifaxan.  Will refill the flagyl 250 mg TID to take for 10 days and will have her restart back on Lomotil, one tablet twice daily.  She will follow-up with Dr. [name] again in 6-8 weeks, but will call in the interim if needed.  ? Need for flex sig to reassess colonic mucosa.   -Rectal bleeding:  Transient bleeding, now almost resolved.  Likely was outlet bleeding.  Will call back if this recurs.

## 2013-06-20 NOTE — Patient Instructions (Addendum)
You have a follow up visit with Dr. [name] for 08-17-2013 at 915 am  We have sent the following medications to your pharmacy for you to pick up at your convenience: Flagyl 250 mg, please take one tablet by mouth three times daily for ten days  Lomotil 2.5-0.025 mg, please take twice daily

## 2013-06-20 NOTE — Progress Notes (Signed)
Reviewed and agree with management.  If bleeding recurs would proceed with sigmoidoscopy.  Let's try to contact the pharmaceutical company to see whether we can get some samples of Lotronex for a one-month trial Herbie Baltimore D. [name], M.D., [facility]

## 2013-06-20 NOTE — Progress Notes (Signed)
Spoke with Barb Merino RN and per Barbera Setters samples of Lotronex are not available.

## 2013-06-21 DIAGNOSIS — Z89511 Acquired absence of right leg below knee: Secondary | ICD-10-CM

## 2013-06-21 DIAGNOSIS — K589 Irritable bowel syndrome without diarrhea: Secondary | ICD-10-CM

## 2013-06-27 ENCOUNTER — Telehealth: Payer: Self-pay | Admitting: *Deleted

## 2013-06-27 MED ORDER — SBI/PROTEIN ISOLATE 5 G PO PACK: PACK | ORAL | Status: DC

## 2013-06-27 NOTE — Telephone Encounter (Signed)
Message copied by Hulan Saas on Wed Jun 27, 2013  1:18 PM ------      Message from: Alonza Bogus D      Created: Wed Jun 27, 2013 12:45 PM      Regarding: Medication       Rollene Fare,            Please contact the patient and let her know that I have thought of something different to try for her diarrhea.  It is called Enteragam.  It is a protein Immunoglobulin that helps aid in digestion and is for people with severe IBS with diarrhea.  It needs to be given twice a day for 15 days then once daily after that.  We can give her two boxes to try it first, but should call us after finishing the first box so that we can order it through the specialty pharmacy (needs to be mail ordered via the sheets that I gave you).  Please let her know that side effects are minimal to none with no serious side effects reported.            Thank you,            [name] ------

## 2013-06-27 NOTE — Telephone Encounter (Signed)
Spoke with patient and gave her recommendation. Samples up front for pick up. Faxed PAP form.

## 2013-07-05 ENCOUNTER — Telehealth: Payer: Self-pay | Admitting: Gastroenterology

## 2013-07-05 NOTE — Telephone Encounter (Signed)
Patient given recommendations. She will restart the Flagyl. OV scheduled on 07/13/13 at 10:45 AM with Dr. [name].

## 2013-07-05 NOTE — Telephone Encounter (Signed)
Left message for pt to call back.  Spoke with pt and she states she cannot afford this, states she cannot even afford the 50.00 copay for OV. Would a referral to [facility] be an option? Please advise.

## 2013-07-05 NOTE — Telephone Encounter (Signed)
Pt called earlier stating that the new medicine -enteragram- is not working that it made her stomach bloat. Per Alonza Bogus PA pt was told to stop the med and have an OV with Dr. [name] (see previous phone note). Pt was also told to restart Flagyl. Pt was given an OV appt and called back stating that she cannot afford the 50 dollar copay to keep coming up here and not having anything done that helps the diarrhea.   Pt requests that Dr. [name] call her.

## 2013-07-05 NOTE — Telephone Encounter (Signed)
Phone rings but no voice mail. Will try again later.

## 2013-07-05 NOTE — Telephone Encounter (Signed)
Spoke with patient and she states she started taking Enteragam on Monday. She took it for 3 days. Her stomach "started blowing up." Last night, she had diarrhea and is "still running to the bathroom." States sometimes she cannot make it to the bathroom. She did not take the Enteragam this morning because she thought it caused her stomach to blow up. Please, advise.

## 2013-07-05 NOTE — Telephone Encounter (Signed)
We have wanted to treat her with Lotronex but she says that she cannot afford it.  See if she can afford to buy a one-week supply.  If she can have her call back after one week.

## 2013-07-05 NOTE — Telephone Encounter (Signed)
[name].  Needs appt with [name] soon.  Restart the flagyl that she was given at her appt with me last time if she still has it in the interim.  Thank you,  [name]

## 2013-07-13 ENCOUNTER — Ambulatory Visit: Payer: Medicare Other | Admitting: Gastroenterology

## 2013-07-16 ENCOUNTER — Telehealth: Payer: Self-pay | Admitting: Gastroenterology

## 2013-07-16 MED ORDER — HYOSCYAMINE SULFATE ER 0.375 MG PO TB12: 0.3750 mg | ORAL_TABLET | Freq: Two times a day (BID) | ORAL | Status: DC | PRN

## 2013-07-16 MED ORDER — GLYCOPYRROLATE 2 MG PO TABS: 2.0000 mg | ORAL_TABLET | Freq: Three times a day (TID) | ORAL | Status: DC

## 2013-07-16 NOTE — Telephone Encounter (Signed)
Pt states that over the weekend she had a "nervous stomach." Pt c/o knots in her stomach and just not feeling well. Pt states she took the xanax that Dr. [name] gave her but states it did not help. Pt also states she tried Dramamine and that it did not help. Please advise.

## 2013-07-16 NOTE — Telephone Encounter (Signed)
Spoke with pt and she is aware, script sent to the pharmacy. 

## 2013-07-16 NOTE — Telephone Encounter (Signed)
Script sent to pharmacy and pt aware. 

## 2013-07-16 NOTE — Telephone Encounter (Signed)
Pt states the hyomax was not covered by her insurance and it was over a hundred dollars. Is there something else she can try for the "nervous stomach?" Please advise.

## 2013-07-16 NOTE — Telephone Encounter (Signed)
Robinul forte 2mg  tid prn

## 2013-07-16 NOTE — Telephone Encounter (Signed)
She should take hyomax 0.375 mg twice a day when necessary for these symptoms

## 2013-07-17 ENCOUNTER — Telehealth: Payer: Self-pay

## 2013-07-17 MED ORDER — DICYCLOMINE HCL 10 MG PO CAPS: 10.0000 mg | ORAL_CAPSULE | Freq: Three times a day (TID) | ORAL | Status: DC

## 2013-07-17 NOTE — Telephone Encounter (Signed)
Pt states the Robinul was also to expensive. Dicyclomine called in for pt.

## 2013-07-19 ENCOUNTER — Telehealth: Payer: Self-pay

## 2013-07-19 DIAGNOSIS — R197 Diarrhea, unspecified: Secondary | ICD-10-CM

## 2013-07-19 MED ORDER — PANCRELIPASE (LIP-PROT-AMYL) 36000-114000 UNITS PO CPEP: ORAL_CAPSULE | ORAL | Status: DC

## 2013-07-19 NOTE — Telephone Encounter (Signed)
Message copied by Algernon Huxley on Thu Jul 19, 2013 11:12 AM ------      Message from: Alonza Bogus D      Created: Wed Jul 18, 2013  2:15 PM       Please have patient try Creon 36000 units take two will meals and one with snacks.  Also, I want her to have stool elastase performed.  She can continue benytl for now as well, but I want to see if the Creon helps with the diarrhea, abdominal pain, etc.            Thank you,            [name] ------

## 2013-07-19 NOTE — Telephone Encounter (Signed)
Pt aware and samples left up front for pt. Pt to come and give stool specimen.

## 2013-07-31 ENCOUNTER — Other Ambulatory Visit: Payer: Medicare Other

## 2013-07-31 DIAGNOSIS — R197 Diarrhea, unspecified: Secondary | ICD-10-CM

## 2013-08-05 ENCOUNTER — Other Ambulatory Visit: Payer: Self-pay | Admitting: Gastroenterology

## 2013-08-08 ENCOUNTER — Telehealth: Payer: Self-pay | Admitting: Gastroenterology

## 2013-08-08 NOTE — Telephone Encounter (Signed)
Spoke with pt and let her know that the results are not back yet.

## 2013-08-13 ENCOUNTER — Telehealth: Payer: Self-pay | Admitting: Gastroenterology

## 2013-08-14 NOTE — Telephone Encounter (Signed)
Samples out front for pt  Pt aware

## 2013-08-15 LAB — PANCREATIC ELASTASE, FECAL: Pancreatic Elastase-1, Stool: 454 mcg/g

## 2013-08-17 ENCOUNTER — Ambulatory Visit: Payer: Medicare Other | Admitting: Gastroenterology

## 2013-10-01 ENCOUNTER — Other Ambulatory Visit: Payer: Self-pay | Admitting: Gastroenterology

## 2013-10-03 ENCOUNTER — Other Ambulatory Visit: Payer: Self-pay | Admitting: Gastroenterology

## 2013-10-03 NOTE — Telephone Encounter (Signed)
Request sent from pharmacy patient needs refill on Lomotil. Per Alonza Bogus, refill okay with two refills. Patient will need an office visit with Dr. [name] soon. RX faxed.

## 2013-10-06 DIAGNOSIS — F419 Anxiety disorder, unspecified: Secondary | ICD-10-CM | POA: Insufficient documentation

## 2013-10-06 DIAGNOSIS — F331 Major depressive disorder, recurrent, moderate: Secondary | ICD-10-CM | POA: Insufficient documentation

## 2013-10-06 DIAGNOSIS — F321 Major depressive disorder, single episode, moderate: Secondary | ICD-10-CM | POA: Insufficient documentation

## 2013-11-16 ENCOUNTER — Telehealth: Payer: Self-pay | Admitting: Gastroenterology

## 2013-11-16 NOTE — Telephone Encounter (Signed)
Off and on rectal pain and notes blood with her bm. She went to the ED a couple of months back with this issue. Last night was "pretty bad". Agrees to an appointment for evaluation. No sob or dizziness. No bleeding presently.

## 2013-11-20 ENCOUNTER — Ambulatory Visit: Payer: Medicare Other | Admitting: Physician Assistant

## 2013-11-21 ENCOUNTER — Telehealth: Payer: Self-pay | Admitting: Gastroenterology

## 2013-11-21 NOTE — Telephone Encounter (Signed)
Complaining of nausea only. Stomach feels bloated. Appetite is decreased. She denies any bowel issues. Pepto-Bismol is what she has tried without relief. Please advise.

## 2013-11-22 ENCOUNTER — Other Ambulatory Visit: Payer: Self-pay

## 2013-11-22 MED ORDER — ONDANSETRON HCL 4 MG PO TABS: 4.0000 mg | ORAL_TABLET | Freq: Four times a day (QID) | ORAL | Status: DC | PRN

## 2013-11-22 NOTE — Telephone Encounter (Signed)
Is she still on flagyl?  Why? If not send Rx for zofran 4mg  q6h prn

## 2013-11-22 NOTE — Telephone Encounter (Signed)
She is not on any ATB or Flagyl at this time. Rx sent to [facility] per patient request. She will call back PRN.

## 2013-11-26 ENCOUNTER — Telehealth: Payer: Self-pay | Admitting: Gastroenterology

## 2013-11-26 NOTE — Telephone Encounter (Signed)
Repeat gastric emptying scan (last examined 2008)  Begin ampicillin 250 mg 4 times a day for 7 days only.  Call back one week

## 2013-11-26 NOTE — Telephone Encounter (Signed)
Pt states that she is having problems with nausea and when she eats the food goes straight through her. States the Lotronex that she is taking for diarrhea is not working anymore. She is taking zofran 4mg  every 6 hours as needed for nausea. Pt states she went to her pcp and she does not have the flu or a virus. Pt wants to know what she can do. Please advise.

## 2013-11-27 ENCOUNTER — Other Ambulatory Visit: Payer: Self-pay

## 2013-11-27 DIAGNOSIS — R1115 Cyclical vomiting syndrome unrelated to migraine: Secondary | ICD-10-CM

## 2013-11-27 MED ORDER — AMPICILLIN 250 MG PO CAPS: 250.0000 mg | ORAL_CAPSULE | Freq: Four times a day (QID) | ORAL | Status: DC

## 2013-11-27 NOTE — Telephone Encounter (Signed)
Left message to call back. She is scheduled for gastric emptying study on 12/06/13 arrive at 7:15 am (this is the first available). NPO for 8 hours prior and no stomach medications. Antibiotics have been called to pharmacy.

## 2013-11-28 NOTE — Telephone Encounter (Signed)
I have left message for the patient to call back 

## 2013-11-29 ENCOUNTER — Telehealth: Payer: Self-pay | Admitting: Gastroenterology

## 2013-11-29 ENCOUNTER — Other Ambulatory Visit: Payer: Self-pay

## 2013-11-29 DIAGNOSIS — R110 Nausea: Secondary | ICD-10-CM

## 2013-11-29 DIAGNOSIS — R1013 Epigastric pain: Secondary | ICD-10-CM

## 2013-11-29 NOTE — Telephone Encounter (Signed)
I have left message for the patient to call back 

## 2013-11-29 NOTE — Telephone Encounter (Signed)
Patient notified and agrees to this plan.

## 2013-12-03 ENCOUNTER — Telehealth: Payer: Self-pay | Admitting: Gastroenterology

## 2013-12-03 NOTE — Telephone Encounter (Signed)
Patient advised and confirmed to do nothing.

## 2013-12-03 NOTE — Telephone Encounter (Signed)
Definitely no MiraLAX.  Since the patient who suffers from chronic diarrhea.  She should do nothing but observe for at least a couple more days

## 2013-12-03 NOTE — Telephone Encounter (Signed)
Spoke with the patient. She is constipated. Having mucous discharge instead of bowel movement. She has taken Docusate capsules (2) in the past 12 hrs. She is not having any bm's yet. Feels bloated. Should she do a purge with Miralax? Please advise.

## 2013-12-06 ENCOUNTER — Ambulatory Visit (HOSPITAL_COMMUNITY)
Admission: RE | Admit: 2013-12-06 | Discharge: 2013-12-06 | Disposition: A | Discharge status: Home / Self Care | Payer: Medicare Other | Source: Ambulatory Visit | Attending: Gastroenterology | Admitting: Gastroenterology

## 2013-12-06 ENCOUNTER — Encounter (HOSPITAL_COMMUNITY): Payer: Self-pay

## 2013-12-06 DIAGNOSIS — R112 Nausea with vomiting, unspecified: Secondary | ICD-10-CM | POA: Diagnosis present

## 2013-12-06 DIAGNOSIS — G43A0 Cyclical vomiting, not intractable: Secondary | ICD-10-CM | POA: Diagnosis present

## 2013-12-06 DIAGNOSIS — R1115 Cyclical vomiting syndrome unrelated to migraine: Secondary | ICD-10-CM

## 2013-12-06 MED ORDER — TECHNETIUM TC 99M SULFUR COLLOID
2.2000 | Freq: Once | INTRAVENOUS | Status: AC | PRN
  Administered 2013-12-06: 2.2 via INTRAVENOUS

## 2013-12-07 NOTE — Progress Notes (Signed)
Quick Note:  Please inform the patient that gastric emptying scan was normal and to continue current plan of action ______ 

## 2013-12-10 ENCOUNTER — Telehealth: Payer: Self-pay | Admitting: Gastroenterology

## 2013-12-10 NOTE — Telephone Encounter (Signed)
Please see the gastric emptying study results

## 2013-12-14 ENCOUNTER — Telehealth: Payer: Self-pay | Admitting: Gastroenterology

## 2013-12-14 NOTE — Telephone Encounter (Signed)
She is taking 2 Imodium in the morning and then takes 1 as needed through the day. She takes Pepto-Bismol in between her Imodium doses. She still cannot leave the house when confidence. Appointment made for re-evaluation.

## 2013-12-25 ENCOUNTER — Ambulatory Visit: Payer: Medicare Other | Admitting: Physician Assistant

## 2013-12-28 ENCOUNTER — Telehealth: Payer: Self-pay | Admitting: Gastroenterology

## 2013-12-28 NOTE — Telephone Encounter (Signed)
Patient states she has not had a bowel movement since last week. She reports she is eating high fiber. Her stomach hurts. She is passing gas. Please advise.

## 2013-12-31 NOTE — Telephone Encounter (Signed)
Instructions left on her voicemail

## 2013-12-31 NOTE — Telephone Encounter (Signed)
She can take 2 doses of MiraLAX

## 2014-01-07 ENCOUNTER — Ambulatory Visit: Payer: Medicare Other | Admitting: Physician Assistant

## 2014-02-11 ENCOUNTER — Other Ambulatory Visit: Payer: Self-pay | Admitting: *Deleted

## 2014-02-11 MED ORDER — ONDANSETRON HCL 4 MG PO TABS: 4.0000 mg | ORAL_TABLET | Freq: Four times a day (QID) | ORAL | Status: DC | PRN

## 2014-03-18 ENCOUNTER — Other Ambulatory Visit: Payer: Self-pay | Admitting: Cardiology

## 2014-03-18 DIAGNOSIS — M79605 Pain in left leg: Secondary | ICD-10-CM

## 2014-03-19 ENCOUNTER — Other Ambulatory Visit: Payer: Self-pay

## 2014-03-25 ENCOUNTER — Other Ambulatory Visit: Payer: Self-pay

## 2014-03-27 ENCOUNTER — Other Ambulatory Visit: Payer: Self-pay

## 2014-03-29 ENCOUNTER — Ambulatory Visit
Admission: RE | Admit: 2014-03-29 | Discharge: 2014-03-29 | Disposition: A | Discharge status: Home / Self Care | Payer: Medicare HMO | Source: Ambulatory Visit | Attending: Cardiology | Admitting: Cardiology

## 2014-03-29 DIAGNOSIS — M79605 Pain in left leg: Secondary | ICD-10-CM

## 2014-03-31 ENCOUNTER — Emergency Department (HOSPITAL_COMMUNITY)
Admission: EM | Admit: 2014-03-31 | Discharge: 2014-03-31 | Disposition: A | Discharge status: Home / Self Care | Payer: Medicare HMO | Attending: Emergency Medicine | Admitting: Emergency Medicine

## 2014-03-31 ENCOUNTER — Emergency Department (HOSPITAL_COMMUNITY): Payer: Medicare HMO

## 2014-03-31 ENCOUNTER — Encounter (HOSPITAL_COMMUNITY): Payer: Self-pay

## 2014-03-31 DIAGNOSIS — Z8659 Personal history of other mental and behavioral disorders: Secondary | ICD-10-CM | POA: Insufficient documentation

## 2014-03-31 DIAGNOSIS — E785 Hyperlipidemia, unspecified: Secondary | ICD-10-CM | POA: Diagnosis not present

## 2014-03-31 DIAGNOSIS — Z87828 Personal history of other (healed) physical injury and trauma: Secondary | ICD-10-CM | POA: Diagnosis not present

## 2014-03-31 DIAGNOSIS — Z79899 Other long term (current) drug therapy: Secondary | ICD-10-CM | POA: Insufficient documentation

## 2014-03-31 DIAGNOSIS — K589 Irritable bowel syndrome without diarrhea: Secondary | ICD-10-CM | POA: Insufficient documentation

## 2014-03-31 DIAGNOSIS — M25561 Pain in right knee: Secondary | ICD-10-CM | POA: Diagnosis not present

## 2014-03-31 DIAGNOSIS — Z89512 Acquired absence of left leg below knee: Secondary | ICD-10-CM | POA: Insufficient documentation

## 2014-03-31 DIAGNOSIS — I10 Essential (primary) hypertension: Secondary | ICD-10-CM | POA: Diagnosis not present

## 2014-03-31 DIAGNOSIS — G8918 Other acute postprocedural pain: Secondary | ICD-10-CM | POA: Insufficient documentation

## 2014-03-31 DIAGNOSIS — Z792 Long term (current) use of antibiotics: Secondary | ICD-10-CM | POA: Insufficient documentation

## 2014-03-31 DIAGNOSIS — G43909 Migraine, unspecified, not intractable, without status migrainosus: Secondary | ICD-10-CM | POA: Insufficient documentation

## 2014-03-31 DIAGNOSIS — M79609 Pain in unspecified limb: Secondary | ICD-10-CM

## 2014-03-31 DIAGNOSIS — T8789 Other complications of amputation stump: Secondary | ICD-10-CM

## 2014-03-31 HISTORY — DX: Pure hypercholesterolemia, unspecified: E78.00

## 2014-03-31 MED ORDER — OXYCODONE-ACETAMINOPHEN 5-325 MG PO TABS: 1.0000 | ORAL_TABLET | ORAL | Status: DC | PRN

## 2014-03-31 MED ORDER — OXYCODONE-ACETAMINOPHEN 5-325 MG PO TABS
1.0000 | ORAL_TABLET | Freq: Once | ORAL | Status: AC
  Administered 2014-03-31: 1 via ORAL
  Filled 2014-03-31: qty 1
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
## 2014-03-31 NOTE — ED Notes (Signed)
Pt has bka from 1992.  Pt states since Friday, she has had nerve pain in stump.  Took muscle relaxers with no relief.  No injury or wound to site.

## 2014-03-31 NOTE — Discharge Instructions (Signed)
Amputation Many new amputations occur each year. The most common causes of amputation of the lower extremity (the hip down) are:  Disease.  Injury caused in an accidents or wars (trauma).  Birth defects.  Lumps (tumors) that are cancer. Upper extremity amputation is usually the result of trauma or birth defect, with disease being a less common cause. COMMON PROBLEMS After an amputation a number of issues need to be considered. Getting around and self-care are early problems that must be dealt with. A complete rehabilitation program will help the amputee recover mobility. A team approach of caregivers helps the most. Caregivers that can provide a well rounded program include:   Physicians.  Therapists.  Nurses.  Social workers.  Psychologists. Usually there are problems with body image and coping with lifestyle changes. A grieving period similar to dealing with a death in the family is common after an amputation. Talking to a trained professional with experience in treating people with similar problems can be very helpful. When returning to a previous lifestyle, questions about sexuality can arise. Many of these uncertainties are normal. These can be discussed with your psychologist or rehabilitation specialist. REHABILITATION AND RETURN TO WORK AND ACTIVITIES Returning to recreational activities and employment are part of recovery. Many times, changes to recreation equipment can allow return to a sport or hobby. A device that substitutes the missing part of the body is called a prosthetic. Many prosthetic manufacturers produce components designed for sports. Be sure to discuss all of your leisure interests with your prosthetist. This is the person who helps provide you with custom made replacement limbs. Your physician will also help to select a prosthetic that will meet your needs. Employers will vary in their willingness to change a work environment in order to help people with  disabilities. Your therapists can perform job site evaluations. Your therapist can then make recommendations to help with your work area. Some amputees will not be able to return to previous jobs. Your local Office of Vocational Rehabilitation can assist you in job retraining.  Once you are past the initial rehabilitation stage you will have ongoing contact with caregivers and a prosthetist. You need to work closely with them in making decisions about your prosthetic device. PROGNOSIS  Amputation should not end your joy of life. There are people with limb loss in nearly all walks of life. They are in a wide variety of professions. They participate in nearly all sports. Ask your caregivers about support groups and sports organizations in your area. They can help you with referral to organizations that will be helpful to you. Document Released: 10/17/2001 Document Revised: 04/19/2011 Document Reviewed: 12/12/2006 [facility] Patient Information 2015 [place], [place]. This information is not intended to replace advice given to you by your health care provider. Make sure you discuss any questions you have with your health care provider.

## 2014-03-31 NOTE — Progress Notes (Signed)
VASCULAR LAB PRELIMINARY  PRELIMINARY  PRELIMINARY  PRELIMINARY  Right lower extremity venous Doppler completed.    Preliminary report:  There is no DVT or SVT noted in the right lower extremity.   Shloime Keilman, RVT 03/31/2014, 4:30 PM

## 2014-04-01 ENCOUNTER — Other Ambulatory Visit: Payer: Self-pay

## 2014-06-10 ENCOUNTER — Other Ambulatory Visit: Payer: Self-pay | Admitting: Nurse Practitioner

## 2014-06-10 DIAGNOSIS — I872 Venous insufficiency (chronic) (peripheral): Secondary | ICD-10-CM

## 2014-06-17 ENCOUNTER — Other Ambulatory Visit: Payer: Self-pay | Admitting: Gastroenterology

## 2014-06-18 ENCOUNTER — Telehealth: Payer: Self-pay | Admitting: Gastroenterology

## 2014-06-18 NOTE — Telephone Encounter (Signed)
Do we have any Viberzi samples to give her?

## 2014-06-18 NOTE — Telephone Encounter (Signed)
No we do not. And she is Medicare, so coverage is doubtful.

## 2014-06-18 NOTE — Telephone Encounter (Signed)
Patient notified. She will pick it tomorrow.

## 2014-06-18 NOTE — Telephone Encounter (Signed)
Upcoming wedding for her son. She is having an IBS diarrhea flare. She is not on any of the meds listed for her GI issues. She is taking Lomotil and Imodium without much improvement.

## 2014-06-18 NOTE — Telephone Encounter (Signed)
I have 1 box of Viberzi.  Have her take beginning 2 days before the wedding.   100mg  bid

## 2014-06-25 ENCOUNTER — Other Ambulatory Visit: Payer: Medicare HMO

## 2014-09-17 ENCOUNTER — Other Ambulatory Visit: Payer: Self-pay

## 2014-09-17 ENCOUNTER — Telehealth: Payer: Self-pay | Admitting: Gastroenterology

## 2014-09-17 MED ORDER — LISINOPRIL 20 MG PO TABS: 20.0000 mg | ORAL_TABLET | Freq: Every day | ORAL | Status: DC

## 2014-09-17 MED ORDER — GABAPENTIN 300 MG PO CAPS: 300.0000 mg | ORAL_CAPSULE | Freq: Three times a day (TID) | ORAL | Status: DC

## 2014-09-17 MED ORDER — ROSUVASTATIN CALCIUM 20 MG PO TABS: 20.0000 mg | ORAL_TABLET | Freq: Every day | ORAL | Status: DC

## 2014-09-17 NOTE — Telephone Encounter (Signed)
The patient has a friend that told her about the "pill that she puts under her tongue and her stomach stops hurting". I have updated her medication list. She has been on a lot of medications including Levsin tablets but I did not see where she had tried Levsin SL. She reports the Viberzi wasn't consistent in helping with her symptoms. That was from back in May.  Can she try Levsin SL?

## 2014-09-18 ENCOUNTER — Other Ambulatory Visit: Payer: Self-pay

## 2014-09-18 MED ORDER — HYOSCYAMINE SULFATE 0.125 MG SL SUBL: 0.1250 mg | SUBLINGUAL_TABLET | SUBLINGUAL | Status: DC | PRN

## 2014-09-18 NOTE — Telephone Encounter (Signed)
yes

## 2014-09-18 NOTE — Telephone Encounter (Signed)
hyomax 0.125mg  s.l. q4h prn

## 2014-09-18 NOTE — Telephone Encounter (Signed)
Rx sent to her pharmacy. Patient aware.

## 2014-09-18 NOTE — Telephone Encounter (Signed)
I need you to give the specifics on the prescription please.

## 2014-10-23 ENCOUNTER — Telehealth: Payer: Self-pay | Admitting: Gastroenterology

## 2014-10-24 ENCOUNTER — Emergency Department (HOSPITAL_COMMUNITY)
Admission: EM | Admit: 2014-10-24 | Discharge: 2014-10-24 | Disposition: A | Discharge status: Home / Self Care | Payer: Medicare HMO

## 2014-10-24 NOTE — Telephone Encounter (Signed)
Patient calling back regarding this. States that she is not any better and would like to be seen as soon as possible. Best # [phone]

## 2014-10-24 NOTE — Telephone Encounter (Signed)
The patient is c/o diarrhea. Denies any laxative agents. She did go to the ER. Agrees to come in for an appointment. She is wanting to take a colon cleanse. i advised against taking any until she discusses this with Dr [name].

## 2014-10-24 NOTE — ED Notes (Signed)
Pt returned labels to Registration and reported that she was leaving.

## 2014-10-29 ENCOUNTER — Encounter: Payer: Self-pay | Admitting: Gastroenterology

## 2014-10-29 ENCOUNTER — Other Ambulatory Visit (INDEPENDENT_AMBULATORY_CARE_PROVIDER_SITE_OTHER): Payer: Medicare HMO

## 2014-10-29 ENCOUNTER — Ambulatory Visit (INDEPENDENT_AMBULATORY_CARE_PROVIDER_SITE_OTHER): Payer: Medicare HMO | Admitting: Gastroenterology

## 2014-10-29 DIAGNOSIS — K589 Irritable bowel syndrome without diarrhea: Secondary | ICD-10-CM | POA: Diagnosis not present

## 2014-10-29 DIAGNOSIS — E785 Hyperlipidemia, unspecified: Secondary | ICD-10-CM

## 2014-10-29 LAB — TSH: TSH: 1.66 u[IU]/mL (ref 0.35–4.50)

## 2014-10-29 MED ORDER — LINACLOTIDE 290 MCG PO CAPS: 290.0000 ug | ORAL_CAPSULE | Freq: Every day | ORAL | Status: DC

## 2014-10-29 NOTE — Progress Notes (Signed)
      History of Present Illness:  Ms. Car is now completing of constipation.  Years she has had poorly controlled diarrhea attributed to IBS.  For several months this has devolved into constipation.  She typically goes 12-14 days without a bowel movement and then has multiple stools which become loose.  While constipated she is lethargic and complains of abdominal bloating and discomfort.  She denies rectal bleeding.    Review of Systems: Pertinent positive and negative review of systems were noted in the above HPI section. All other review of systems were otherwise negative.    Current Medications, Allergies, Past Medical History, Past Surgical History, Family History and Social History were reviewed in [facility] record  Vital signs were reviewed in today's medical record. Physical Exam: General: Well developed , well nourished, no acute distress Skin: anicteric Head: Normocephalic and atraumatic Eyes:  sclerae anicteric, EOMI Ears: Normal auditory acuity Mouth: No deformity or lesions Lymph Nodes: no lymphadenopathy Lungs: Clear throughout to auscultation Heart: Regular rate and rhythm; no murmurs, rubs or brui: Gastroinestinal:  Soft, non tender and non distended. No masses, hepatosplenomegaly or hernias noted. Normal Bowel sounds Rectal:deferred Musculoskeletal: Symmetrical with no gross deformities  Pulses:  Normal pulses noted Extremities: No clubbing, cyanosis, edema or deformities noted Neurological: Alert oriented x 4, grossly nonfocal Psychological:  Alert and cooperative. Normal mood and affect  See Assessment and Plan under Problem List

## 2014-10-29 NOTE — Patient Instructions (Signed)
Go to the basement for labs today Linzess coupon given Follow up in 3 months

## 2014-10-29 NOTE — Assessment & Plan Note (Addendum)
Patient now has severe constipation which is undoubtedly  related to IBS.  Relatively recent colonoscopy (2013) renders a structural abnormality of the colon highly unlikely.  We'll check thyroid function tests  Recommendations #1 trial of Linzess 290 g daily #2 check TSH and T4 levels

## 2014-10-30 ENCOUNTER — Telehealth: Payer: Self-pay | Admitting: Gastroenterology

## 2014-10-30 LAB — T4: T4, Total: 7.9 ug/dL (ref 4.5–12.0)

## 2014-10-30 NOTE — Telephone Encounter (Signed)
Dr [name], patient took one linzess yesterday and has went to the bathroom 6 times. [name] is complining that the linzess is now giving her diarrhea. I told her to stop taking it for now. Dr [name] Please advise

## 2014-10-31 NOTE — Telephone Encounter (Signed)
D/c Linzess.  Once diarrhea as subsided begin miralax 1 dose qd

## 2014-11-04 NOTE — Telephone Encounter (Signed)
Tried to contact patient. L/M for her to contact the office if she does not feel any better

## 2015-01-10 ENCOUNTER — Encounter: Payer: Self-pay | Admitting: Internal Medicine

## 2015-01-10 ENCOUNTER — Encounter: Payer: Self-pay | Admitting: Gastroenterology

## 2015-01-13 ENCOUNTER — Encounter: Payer: Self-pay | Admitting: Gastroenterology

## 2015-03-12 ENCOUNTER — Ambulatory Visit: Payer: Medicare HMO | Admitting: Gastroenterology

## 2015-03-13 ENCOUNTER — Ambulatory Visit: Payer: Medicare HMO | Admitting: Internal Medicine

## 2015-04-28 ENCOUNTER — Other Ambulatory Visit: Payer: Self-pay | Admitting: Gastroenterology

## 2015-05-21 ENCOUNTER — Other Ambulatory Visit: Payer: Self-pay | Admitting: Orthopedic Surgery

## 2015-05-21 DIAGNOSIS — M25521 Pain in right elbow: Secondary | ICD-10-CM

## 2015-05-26 ENCOUNTER — Other Ambulatory Visit: Payer: Medicare HMO

## 2015-08-18 DIAGNOSIS — F5101 Primary insomnia: Secondary | ICD-10-CM | POA: Insufficient documentation

## 2015-08-29 ENCOUNTER — Ambulatory Visit: Payer: Medicare HMO | Admitting: Physical Medicine & Rehabilitation

## 2015-09-19 ENCOUNTER — Ambulatory Visit: Payer: Medicare HMO | Admitting: Physical Medicine & Rehabilitation

## 2015-10-14 ENCOUNTER — Ambulatory Visit: Payer: Medicare HMO | Admitting: Physical Medicine & Rehabilitation

## 2015-10-24 ENCOUNTER — Ambulatory Visit: Payer: Medicare HMO | Admitting: Physical Medicine & Rehabilitation

## 2015-11-07 ENCOUNTER — Ambulatory Visit: Payer: Medicare HMO | Admitting: Gastroenterology

## 2015-11-07 ENCOUNTER — Ambulatory Visit: Payer: Medicare HMO | Admitting: Physical Medicine & Rehabilitation

## 2016-03-17 DIAGNOSIS — E559 Vitamin D deficiency, unspecified: Secondary | ICD-10-CM | POA: Insufficient documentation

## 2016-03-17 DIAGNOSIS — E538 Deficiency of other specified B group vitamins: Secondary | ICD-10-CM | POA: Insufficient documentation

## 2016-06-08 HISTORY — PX: ESOPHAGOGASTRODUODENOSCOPY: SHX1529

## 2016-07-14 ENCOUNTER — Emergency Department (HOSPITAL_COMMUNITY)
Admission: EM | Admit: 2016-07-14 | Discharge: 2016-07-14 | Disposition: A | Discharge status: Home / Self Care | Payer: Medicare HMO | Attending: Emergency Medicine | Admitting: Emergency Medicine

## 2016-07-14 ENCOUNTER — Encounter (HOSPITAL_COMMUNITY): Payer: Self-pay | Admitting: Family Medicine

## 2016-07-14 DIAGNOSIS — R252 Cramp and spasm: Secondary | ICD-10-CM | POA: Insufficient documentation

## 2016-07-14 NOTE — ED Triage Notes (Signed)
Patient is from home was notified today by PCP, Dr. Carola Rhine, with [facility] that her potassium is elevated. Pt reports she had blood drawn yesterday and today. Pt is complaining of burning and cramping in her hands.

## 2016-11-25 ENCOUNTER — Other Ambulatory Visit: Payer: Self-pay | Admitting: Nephrology

## 2016-11-25 DIAGNOSIS — N179 Acute kidney failure, unspecified: Secondary | ICD-10-CM

## 2016-11-25 DIAGNOSIS — I10 Essential (primary) hypertension: Secondary | ICD-10-CM

## 2016-12-03 ENCOUNTER — Other Ambulatory Visit: Payer: Medicare HMO

## 2016-12-13 ENCOUNTER — Other Ambulatory Visit: Payer: Self-pay | Admitting: Physician Assistant

## 2016-12-13 DIAGNOSIS — Z1231 Encounter for screening mammogram for malignant neoplasm of breast: Secondary | ICD-10-CM

## 2016-12-16 ENCOUNTER — Ambulatory Visit: Payer: Medicare HMO

## 2017-01-13 ENCOUNTER — Ambulatory Visit: Payer: Medicare HMO

## 2017-01-20 ENCOUNTER — Other Ambulatory Visit: Payer: Medicare HMO

## 2017-01-27 DIAGNOSIS — R809 Proteinuria, unspecified: Secondary | ICD-10-CM | POA: Insufficient documentation

## 2017-02-17 ENCOUNTER — Encounter (INDEPENDENT_AMBULATORY_CARE_PROVIDER_SITE_OTHER): Payer: Self-pay

## 2017-02-17 ENCOUNTER — Ambulatory Visit
Admission: RE | Admit: 2017-02-17 | Discharge: 2017-02-17 | Disposition: A | Discharge status: Home / Self Care | Payer: Medicare HMO | Source: Ambulatory Visit | Attending: Nephrology | Admitting: Nephrology

## 2017-02-17 DIAGNOSIS — N179 Acute kidney failure, unspecified: Secondary | ICD-10-CM

## 2017-02-17 DIAGNOSIS — I10 Essential (primary) hypertension: Secondary | ICD-10-CM

## 2017-04-18 ENCOUNTER — Encounter: Payer: Self-pay | Admitting: Neurology

## 2017-04-18 ENCOUNTER — Telehealth: Payer: Self-pay | Admitting: Neurology

## 2017-04-18 ENCOUNTER — Ambulatory Visit: Payer: Medicare HMO | Admitting: Neurology

## 2017-04-18 VITALS — BP 148/95 | HR 79 | Ht 60.0 in | Wt 169.0 lb

## 2017-04-18 DIAGNOSIS — C760 Malignant neoplasm of head, face and neck: Secondary | ICD-10-CM

## 2017-04-18 DIAGNOSIS — R29818 Other symptoms and signs involving the nervous system: Secondary | ICD-10-CM

## 2017-04-18 DIAGNOSIS — R29898 Other symptoms and signs involving the musculoskeletal system: Secondary | ICD-10-CM | POA: Diagnosis not present

## 2017-04-18 DIAGNOSIS — G902 Horner's syndrome: Secondary | ICD-10-CM

## 2017-04-18 DIAGNOSIS — R270 Ataxia, unspecified: Secondary | ICD-10-CM | POA: Diagnosis not present

## 2017-04-18 DIAGNOSIS — I7771 Dissection of carotid artery: Secondary | ICD-10-CM

## 2017-04-18 DIAGNOSIS — R42 Dizziness and giddiness: Secondary | ICD-10-CM

## 2017-04-18 MED ORDER — ALPRAZOLAM 0.25 MG PO TABS: ORAL_TABLET | ORAL | 0 refills | Status: DC

## 2017-04-18 NOTE — Patient Instructions (Signed)
MRI of the head and neck Cat scan of the chest

## 2017-04-18 NOTE — Telephone Encounter (Signed)
Patient was seen today and Dr. Jaynee Eagles ordered an MRI and CT scan. Patient wants to know if she should go now to the hospital to have these tests. I advised someone will call her to schedule those tests but she thinks she should go to the hospital.
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
## 2017-04-18 NOTE — Telephone Encounter (Signed)
Spoke with the patient. She stated she is probably going to go ahead to the [facility] ED for imaging. Her husband is also worried and would like it done sooner. D/w Dr. Jaynee Eagles. Will call [facility] hospital to notify.   Spoke with Verdis Frederickson, charge RN regarding pt going to ED instead of waiting on outpatient imaging. Discussed that she presented in office today with several weeks of dizziness and new onset Horner's Syndrome. Dr. Jaynee Eagles would like MRI brain, MRA head and neck for stroke and dissection evaluation. Also she would like CT chest. Plan was to order anti-anxiety med prior to scans. She verbalized understanding. She has office number for contact.

## 2017-04-18 NOTE — Telephone Encounter (Signed)
I spoke to the ED doc. Instructed they could do a limited MRI and if no stroke we can complete the workup outpatient . If there is acte/subacute stroke then they can admit or send to [facility] for eval of dissection. thanks

## 2017-04-18 NOTE — Addendum Note (Signed)
Addended by: Sarina Ill B on: 04/18/2017 06:17 PM   Modules accepted: Orders

## 2017-04-19 ENCOUNTER — Telehealth: Payer: Self-pay | Admitting: Neurology

## 2017-04-19 LAB — BASIC METABOLIC PANEL
BUN/Creatinine Ratio: 14 (ref 9–23)
BUN: 13 mg/dL (ref 6–24)
CALCIUM: 9.6 mg/dL (ref 8.7–10.2)
CHLORIDE: 106 mmol/L (ref 96–106)
CO2: 25 mmol/L (ref 20–29)
CREATININE: 0.94 mg/dL (ref 0.57–1.00)
GFR calc Af Amer: 79 mL/min/{1.73_m2} (ref >59)
GFR calc non Af Amer: 69 mL/min/{1.73_m2} (ref >59)
Glucose: 91 mg/dL (ref 65–99)
Potassium: 4.1 mmol/L (ref 3.5–5.2)
Sodium: 147 mmol/L — ABNORMAL HIGH (ref 134–144)

## 2017-04-19 NOTE — Telephone Encounter (Signed)
Spoke with patient. Informed her that per Raquel Sarna w/ MRI, the MRI was approved today. She is unsure what insurance will pay. GI should be calling pt to discuss scheduling and go over her benefits. The patient was appreciative and is concerned that financially she may not be able to have another but will see what GI says.

## 2017-04-19 NOTE — Telephone Encounter (Signed)
When you get a chance can you change the MRA Neck wo contrast to a MRA neck w/wo contrast.

## 2017-04-19 NOTE — Telephone Encounter (Signed)
She has kidney disease so unsure if I can give her contrast

## 2017-04-19 NOTE — Telephone Encounter (Signed)
Error

## 2017-04-19 NOTE — Addendum Note (Signed)
Addended by: Sarina Ill B on: 04/19/2017 08:07 AM   Modules accepted: Orders

## 2017-04-19 NOTE — Telephone Encounter (Signed)
04/19/17 [employer] medicare Josem Kaufmann: [id number] (exp. 04/19/17 to 05/19/17) order sent to GI EE

## 2017-04-19 NOTE — Telephone Encounter (Signed)
Pt called this morning stating that when she went to the Hospital last night she was told there may have been a mini stroke and that she needed to f/u with Dr Jaynee Eagles as soon as possible.  Under the advisement of RN Romelle Starcher pt was told that the office will be touch re:CT chest and MRA head and neck .  Pt mentioned she will wait but also stated she has been feeling very dizzy.  Pt was asked to hold while that was relayed to RN but the call was somehow disconnected

## 2017-04-19 NOTE — Telephone Encounter (Signed)
I spoke to [place] at [facility]. She informed me that they will do labs beforehand if the patient can handle some of the contrast they will do a little if not they won't do the contrast at all.. If you put the MRA neck w/wo contrast I can get it approved for both just incase they can do some contrast.

## 2017-04-19 NOTE — Telephone Encounter (Signed)
Faxed Xanax prescription to pharmacy. Received a receipt of confirmation.

## 2017-04-19 NOTE — Telephone Encounter (Signed)
Spoke with the patient. She stated that she also had an xray of her chest yesterday. She stated that she is going to see a heart specialist next Tuesday re: BP and tachycardia. She also stated that yesterday while she was in the ED she had some tingling in the L side of her face. She told the doctor. Her L eye has also been watering. She was prescribed Meclizine 25 mg TID which she has not picked up yet. Discussed Xanax prescription 0.25 mg tablet. Take 1-2 tablets 30-60 minutes before MRI. May repeat. No not drive. She verbalized appreciation and understanding.

## 2017-04-19 NOTE — Telephone Encounter (Signed)
Patient calling back to discuss having an MRI. She does not think her insurance will pay since she just had one recently.

## 2017-04-19 NOTE — Telephone Encounter (Signed)
Dr. Jaynee Eagles reviewed MRI brain, no stroke. Will continue with previously planned work-up of CT chest and MRA head & neck.

## 2017-04-20 ENCOUNTER — Telehealth: Payer: Self-pay | Admitting: Neurology

## 2017-04-20 NOTE — Telephone Encounter (Signed)
Called with results. Pt verbalized understanding.

## 2017-04-20 NOTE — Telephone Encounter (Signed)
Called the patient and made her aware of lab work being normal. She states that she has been told the insurance will not cover for her to have MRI. I informed the patient and will reach out to our MRI coordinator and see if there has been a problem with getting approval. Pt verbalized understanding.

## 2017-04-20 NOTE — Telephone Encounter (Signed)
-----   Message from Melvenia Beam, MD sent at 04/20/2017 10:31 AM EDT ----- Labs unremarkable

## 2017-04-20 NOTE — Telephone Encounter (Signed)
FYI Pt called to inform that her insurance company told her they will approve the MRI and GI but that they will not pay for it.  Because pt is having to pay for the MRI done at the hospital she does not want to have the MRI done at GI.  Pt has not requested a call back

## 2017-06-24 DIAGNOSIS — R34 Anuria and oliguria: Secondary | ICD-10-CM | POA: Insufficient documentation

## 2017-07-19 ENCOUNTER — Ambulatory Visit: Payer: Medicare HMO | Admitting: Nurse Practitioner

## 2018-04-13 ENCOUNTER — Other Ambulatory Visit: Payer: Self-pay | Admitting: Family Medicine

## 2018-04-13 DIAGNOSIS — Z1231 Encounter for screening mammogram for malignant neoplasm of breast: Secondary | ICD-10-CM

## 2018-05-11 ENCOUNTER — Ambulatory Visit: Payer: Medicare HMO

## 2019-07-02 ENCOUNTER — Encounter: Payer: Self-pay | Admitting: Physical Medicine & Rehabilitation

## 2019-07-19 ENCOUNTER — Encounter: Payer: Self-pay | Admitting: Physical Medicine & Rehabilitation

## 2019-07-19 ENCOUNTER — Other Ambulatory Visit: Payer: Self-pay

## 2019-07-19 ENCOUNTER — Encounter: Payer: Medicare HMO | Attending: Physical Medicine & Rehabilitation | Admitting: Physical Medicine & Rehabilitation

## 2019-07-19 VITALS — BP 137/87 | HR 69 | Temp 96.1°F | Ht 60.0 in | Wt 185.6 lb

## 2019-07-19 DIAGNOSIS — G479 Sleep disorder, unspecified: Secondary | ICD-10-CM | POA: Diagnosis not present

## 2019-07-19 DIAGNOSIS — R269 Unspecified abnormalities of gait and mobility: Secondary | ICD-10-CM | POA: Insufficient documentation

## 2019-07-19 DIAGNOSIS — M791 Myalgia, unspecified site: Secondary | ICD-10-CM | POA: Insufficient documentation

## 2019-07-19 DIAGNOSIS — Z89511 Acquired absence of right leg below knee: Secondary | ICD-10-CM | POA: Insufficient documentation

## 2019-07-19 MED ORDER — METHOCARBAMOL 500 MG PO TABS: 500.0000 mg | ORAL_TABLET | Freq: Two times a day (BID) | ORAL | 1 refills | Status: DC | PRN
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
## 2019-08-02 ENCOUNTER — Encounter: Payer: Medicare HMO | Admitting: Physical Medicine & Rehabilitation

## 2019-08-17 ENCOUNTER — Telehealth: Payer: Self-pay | Admitting: *Deleted

## 2019-08-17 NOTE — Telephone Encounter (Signed)
Notified.  She says she is getting fitted with new one on Tuesday.  I advised her to be seen in Urgent Care or ED if blistering or looks like may have infection brewing. She agrees.

## 2019-08-17 NOTE — Telephone Encounter (Signed)
Thank you :)

## 2019-08-17 NOTE — Telephone Encounter (Signed)
[name] called and says that her stump is blistering and burning and itching and nothing is helping it. Please advise.

## 2019-08-17 NOTE — Telephone Encounter (Signed)
If it is blistering, that is new and should be evaluated further.  She was supposed to follow up with her prosthetist due to ill-fitting prosthesis.  She should stop wearing her prosthesis for the time being, she may use it for transfers.  Thanks.

## 2019-08-27 ENCOUNTER — Encounter: Payer: Medicare HMO | Admitting: Physical Medicine & Rehabilitation

## 2019-11-18 ENCOUNTER — Other Ambulatory Visit: Payer: Self-pay | Admitting: Physical Medicine & Rehabilitation

## 2019-12-01 ENCOUNTER — Other Ambulatory Visit: Payer: Self-pay | Admitting: Physical Medicine & Rehabilitation

## 2020-02-19 ENCOUNTER — Other Ambulatory Visit: Payer: Self-pay | Admitting: Sports Medicine

## 2020-02-19 DIAGNOSIS — S83249A Other tear of medial meniscus, current injury, unspecified knee, initial encounter: Secondary | ICD-10-CM

## 2020-03-07 ENCOUNTER — Other Ambulatory Visit: Payer: Medicare HMO

## 2020-03-08 ENCOUNTER — Other Ambulatory Visit: Payer: Medicare HMO

## 2020-03-18 ENCOUNTER — Ambulatory Visit
Admission: RE | Admit: 2020-03-18 | Discharge: 2020-03-18 | Disposition: A | Discharge status: Home / Self Care | Payer: Medicare Other | Source: Ambulatory Visit | Attending: Sports Medicine | Admitting: Sports Medicine

## 2020-03-18 DIAGNOSIS — S83249A Other tear of medial meniscus, current injury, unspecified knee, initial encounter: Secondary | ICD-10-CM

## 2020-04-12 ENCOUNTER — Other Ambulatory Visit: Payer: Self-pay | Admitting: Physical Medicine & Rehabilitation

## 2020-05-24 ENCOUNTER — Other Ambulatory Visit: Payer: Self-pay | Admitting: Physical Medicine & Rehabilitation

## 2020-06-05 ENCOUNTER — Other Ambulatory Visit: Payer: Self-pay | Admitting: Physical Medicine & Rehabilitation

## 2020-08-12 ENCOUNTER — Other Ambulatory Visit: Payer: Self-pay | Admitting: Physical Medicine & Rehabilitation

## 2020-10-23 DIAGNOSIS — D72820 Lymphocytosis (symptomatic): Secondary | ICD-10-CM | POA: Insufficient documentation

## 2020-10-24 ENCOUNTER — Emergency Department (HOSPITAL_BASED_OUTPATIENT_CLINIC_OR_DEPARTMENT_OTHER): Payer: Medicare Other

## 2020-10-24 ENCOUNTER — Other Ambulatory Visit: Payer: Self-pay

## 2020-10-24 ENCOUNTER — Emergency Department (HOSPITAL_BASED_OUTPATIENT_CLINIC_OR_DEPARTMENT_OTHER)
Admission: EM | Admit: 2020-10-24 | Discharge: 2020-10-24 | Disposition: A | Discharge status: Home / Self Care | Payer: Medicare Other | Attending: Emergency Medicine | Admitting: Emergency Medicine

## 2020-10-24 ENCOUNTER — Encounter (HOSPITAL_BASED_OUTPATIENT_CLINIC_OR_DEPARTMENT_OTHER): Payer: Self-pay | Admitting: *Deleted

## 2020-10-24 DIAGNOSIS — H5702 Anisocoria: Secondary | ICD-10-CM

## 2020-10-24 DIAGNOSIS — Z23 Encounter for immunization: Secondary | ICD-10-CM | POA: Diagnosis not present

## 2020-10-24 DIAGNOSIS — S0081XA Abrasion of other part of head, initial encounter: Secondary | ICD-10-CM | POA: Diagnosis not present

## 2020-10-24 DIAGNOSIS — W01198A Fall on same level from slipping, tripping and stumbling with subsequent striking against other object, initial encounter: Secondary | ICD-10-CM | POA: Insufficient documentation

## 2020-10-24 DIAGNOSIS — Z79899 Other long term (current) drug therapy: Secondary | ICD-10-CM | POA: Diagnosis not present

## 2020-10-24 DIAGNOSIS — Z20822 Contact with and (suspected) exposure to covid-19: Secondary | ICD-10-CM | POA: Diagnosis not present

## 2020-10-24 DIAGNOSIS — S00212A Abrasion of left eyelid and periocular area, initial encounter: Secondary | ICD-10-CM | POA: Insufficient documentation

## 2020-10-24 DIAGNOSIS — S0592XA Unspecified injury of left eye and orbit, initial encounter: Secondary | ICD-10-CM | POA: Diagnosis present

## 2020-10-24 DIAGNOSIS — S0990XA Unspecified injury of head, initial encounter: Secondary | ICD-10-CM | POA: Diagnosis not present

## 2020-10-24 DIAGNOSIS — I10 Essential (primary) hypertension: Secondary | ICD-10-CM | POA: Insufficient documentation

## 2020-10-24 DIAGNOSIS — Y900 Blood alcohol level of less than 20 mg/100 ml: Secondary | ICD-10-CM | POA: Insufficient documentation

## 2020-10-24 DIAGNOSIS — W19XXXA Unspecified fall, initial encounter: Secondary | ICD-10-CM

## 2020-10-24 HISTORY — DX: Abnormal level of blood mineral: R79.0

## 2020-10-24 LAB — CBC WITH DIFFERENTIAL/PLATELET
Abs Immature Granulocytes: 0.03 10*3/uL (ref 0.00–0.07)
Basophils Absolute: 0 10*3/uL (ref 0.0–0.1)
Basophils Relative: 0 %
Eosinophils Absolute: 0.3 10*3/uL (ref 0.0–0.5)
Eosinophils Relative: 3 %
HCT: 37.3 % (ref 36.0–46.0)
Hemoglobin: 12.3 g/dL (ref 12.0–15.0)
Immature Granulocytes: 0 %
Lymphocytes Relative: 41 %
Lymphs Abs: 3.9 10*3/uL (ref 0.7–4.0)
MCH: 32.2 pg (ref 26.0–34.0)
MCHC: 33 g/dL (ref 30.0–36.0)
MCV: 97.6 fL (ref 80.0–100.0)
Monocytes Absolute: 0.7 10*3/uL (ref 0.1–1.0)
Monocytes Relative: 7 %
Neutro Abs: 4.7 10*3/uL (ref 1.7–7.7)
Neutrophils Relative %: 49 %
Platelets: 326 10*3/uL (ref 150–400)
RBC: 3.82 MIL/uL — ABNORMAL LOW (ref 3.87–5.11)
RDW: 12.9 % (ref 11.5–15.5)
WBC: 9.6 10*3/uL (ref 4.0–10.5)
nRBC: 0 % (ref 0.0–0.2)

## 2020-10-24 LAB — URINALYSIS, ROUTINE W REFLEX MICROSCOPIC
Bilirubin Urine: NEGATIVE
Glucose, UA: 250 mg/dL — AB
Hgb urine dipstick: NEGATIVE
Ketones, ur: NEGATIVE mg/dL
Leukocytes,Ua: NEGATIVE
Nitrite: NEGATIVE
Protein, ur: NEGATIVE mg/dL
Specific Gravity, Urine: 1.024 (ref 1.005–1.030)
pH: 5.5 (ref 5.0–8.0)

## 2020-10-24 LAB — RAPID URINE DRUG SCREEN, HOSP PERFORMED
Amphetamines: NOT DETECTED
Barbiturates: NOT DETECTED
Benzodiazepines: NOT DETECTED
Cocaine: NOT DETECTED
Opiates: NOT DETECTED
Tetrahydrocannabinol: NOT DETECTED

## 2020-10-24 LAB — BASIC METABOLIC PANEL
Anion gap: 9 (ref 5–15)
BUN: 19 mg/dL (ref 6–20)
CO2: 30 mmol/L (ref 22–32)
Calcium: 9.7 mg/dL (ref 8.9–10.3)
Chloride: 104 mmol/L (ref 98–111)
Creatinine, Ser: 0.65 mg/dL (ref 0.44–1.00)
GFR, Estimated: >60 mL/min (ref >60)
Glucose, Bld: 95 mg/dL (ref 70–99)
Potassium: 3.9 mmol/L (ref 3.5–5.1)
Sodium: 143 mmol/L (ref 135–145)

## 2020-10-24 LAB — HEPATIC FUNCTION PANEL
ALT: 55 U/L — ABNORMAL HIGH (ref 0–44)
AST: 54 U/L — ABNORMAL HIGH (ref 15–41)
Albumin: 4.3 g/dL (ref 3.5–5.0)
Alkaline Phosphatase: 81 U/L (ref 38–126)
Bilirubin, Direct: 0.1 mg/dL (ref 0.0–0.2)
Indirect Bilirubin: 0.3 mg/dL (ref 0.3–0.9)
Total Bilirubin: 0.4 mg/dL (ref 0.3–1.2)
Total Protein: 7.3 g/dL (ref 6.5–8.1)

## 2020-10-24 LAB — ETHANOL: Alcohol, Ethyl (B): 13 mg/dL — ABNORMAL HIGH (ref <10)

## 2020-10-24 LAB — AMMONIA: Ammonia: 25 umol/L (ref 9–35)

## 2020-10-24 MED ORDER — SODIUM CHLORIDE 0.9 % IV BOLUS
1000.0000 mL | Freq: Once | INTRAVENOUS | Status: AC
  Administered 2020-10-24: 1000 mL via INTRAVENOUS

## 2020-10-24 MED ORDER — ONDANSETRON HCL 4 MG PO TABS: 4.0000 mg | ORAL_TABLET | Freq: Three times a day (TID) | ORAL | 0 refills | Status: DC | PRN

## 2020-10-24 MED ORDER — ONDANSETRON HCL 4 MG/2ML IJ SOLN
4.0000 mg | Freq: Once | INTRAMUSCULAR | Status: AC
  Administered 2020-10-24: 4 mg via INTRAVENOUS
  Filled 2020-10-24: qty 2

## 2020-10-24 MED ORDER — ACETAMINOPHEN 500 MG PO TABS: 500.0000 mg | ORAL_TABLET | Freq: Four times a day (QID) | ORAL | 0 refills | Status: DC | PRN

## 2020-10-24 MED ORDER — TETANUS-DIPHTH-ACELL PERTUSSIS 5-2.5-18.5 LF-MCG/0.5 IM SUSY
0.5000 mL | PREFILLED_SYRINGE | Freq: Once | INTRAMUSCULAR | Status: AC
  Administered 2020-10-24: 0.5 mL via INTRAMUSCULAR
  Filled 2020-10-24: qty 0.5

## 2020-10-24 MED ORDER — MORPHINE SULFATE (PF) 4 MG/ML IV SOLN
4.0000 mg | Freq: Once | INTRAVENOUS | Status: AC
  Administered 2020-10-24: 4 mg via INTRAVENOUS
  Filled 2020-10-24: qty 1

## 2020-10-24 NOTE — Discharge Instructions (Signed)
Please avoid taking multiple medication that can be sedating as it may increase the risk of fall.  Your blood pressure is elevated today, please follow-up with your doctor for blood pressure recheck.  Take Zofran as needed for nausea, take Tylenol as needed for headache.  Apply over-the-counter Neosporin cream to the abrasions several times daily to decrease risk of infection.  Return to the ER if you have any concern.

## 2020-10-24 NOTE — ED Notes (Signed)
Pa notified of Pt symtoms of hand tremor in right hand.

## 2020-10-24 NOTE — ED Triage Notes (Addendum)
Pt fell this morning about 12 am. Hit left eye against bar, swelling and bruising noted. Pin in neck and shoulder as well, Unsure how long she was down before husband found her. She does not remember falling. No palpable tenderness in neck, lower back pain

## 2020-10-24 NOTE — ED Notes (Signed)
Pt stated that she did not take her BP meds this morning. Pt stated that her BP was running [id number] systolic earlier in the week. PA notified
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
## 2020-12-19 DIAGNOSIS — R7303 Prediabetes: Secondary | ICD-10-CM | POA: Insufficient documentation

## 2021-04-07 DIAGNOSIS — R7989 Other specified abnormal findings of blood chemistry: Secondary | ICD-10-CM | POA: Insufficient documentation

## 2021-05-06 ENCOUNTER — Emergency Department (HOSPITAL_BASED_OUTPATIENT_CLINIC_OR_DEPARTMENT_OTHER): Payer: Medicare HMO

## 2021-05-06 ENCOUNTER — Other Ambulatory Visit: Payer: Self-pay

## 2021-05-06 ENCOUNTER — Encounter (HOSPITAL_BASED_OUTPATIENT_CLINIC_OR_DEPARTMENT_OTHER): Payer: Self-pay

## 2021-05-06 ENCOUNTER — Observation Stay (HOSPITAL_BASED_OUTPATIENT_CLINIC_OR_DEPARTMENT_OTHER)
Admission: EM | Admit: 2021-05-06 | Discharge: 2021-05-08 | Disposition: A | Discharge status: Home / Self Care | Payer: Medicare HMO | Attending: Internal Medicine | Admitting: Internal Medicine

## 2021-05-06 DIAGNOSIS — I10 Essential (primary) hypertension: Secondary | ICD-10-CM | POA: Diagnosis present

## 2021-05-06 DIAGNOSIS — E785 Hyperlipidemia, unspecified: Secondary | ICD-10-CM | POA: Diagnosis present

## 2021-05-06 DIAGNOSIS — Z79899 Other long term (current) drug therapy: Secondary | ICD-10-CM | POA: Diagnosis not present

## 2021-05-06 DIAGNOSIS — R0602 Shortness of breath: Secondary | ICD-10-CM | POA: Diagnosis present

## 2021-05-06 DIAGNOSIS — Z20822 Contact with and (suspected) exposure to covid-19: Secondary | ICD-10-CM | POA: Diagnosis not present

## 2021-05-06 DIAGNOSIS — Z89511 Acquired absence of right leg below knee: Secondary | ICD-10-CM

## 2021-05-06 DIAGNOSIS — I2699 Other pulmonary embolism without acute cor pulmonale: Secondary | ICD-10-CM | POA: Diagnosis not present

## 2021-05-06 HISTORY — DX: Fatty (change of) liver, not elsewhere classified: K76.0

## 2021-05-06 LAB — BASIC METABOLIC PANEL
Anion gap: 9 (ref 5–15)
BUN: 22 mg/dL — ABNORMAL HIGH (ref 6–20)
CO2: 31 mmol/L (ref 22–32)
Calcium: 9.8 mg/dL (ref 8.9–10.3)
Chloride: 97 mmol/L — ABNORMAL LOW (ref 98–111)
Creatinine, Ser: 0.92 mg/dL (ref 0.44–1.00)
GFR, Estimated: >60 mL/min (ref >60)
Glucose, Bld: 104 mg/dL — ABNORMAL HIGH (ref 70–99)
Potassium: 3.6 mmol/L (ref 3.5–5.1)
Sodium: 137 mmol/L (ref 135–145)

## 2021-05-06 LAB — RESP PANEL BY RT-PCR (FLU A&B, COVID) ARPGX2
Influenza A by PCR: NEGATIVE
Influenza B by PCR: NEGATIVE
SARS Coronavirus 2 by RT PCR: NEGATIVE

## 2021-05-06 LAB — BRAIN NATRIURETIC PEPTIDE: B Natriuretic Peptide: 24.3 pg/mL (ref 0.0–100.0)

## 2021-05-06 LAB — CBC
HCT: 37.3 % (ref 36.0–46.0)
Hemoglobin: 12 g/dL (ref 12.0–15.0)
MCH: 29.9 pg (ref 26.0–34.0)
MCHC: 32.2 g/dL (ref 30.0–36.0)
MCV: 92.8 fL (ref 80.0–100.0)
Platelets: 348 10*3/uL (ref 150–400)
RBC: 4.02 MIL/uL (ref 3.87–5.11)
RDW: 13.4 % (ref 11.5–15.5)
WBC: 8.5 10*3/uL (ref 4.0–10.5)
nRBC: 0 % (ref 0.0–0.2)

## 2021-05-06 LAB — D-DIMER, QUANTITATIVE: D-Dimer, Quant: 1.64 ug/mL-FEU — ABNORMAL HIGH (ref 0.00–0.50)

## 2021-05-06 LAB — TROPONIN I (HIGH SENSITIVITY)
Troponin I (High Sensitivity): 3 ng/L (ref <18)
Troponin I (High Sensitivity): 3 ng/L (ref <18)

## 2021-05-06 LAB — HEPARIN LEVEL (UNFRACTIONATED): Heparin Unfractionated: 0.52 IU/mL (ref 0.30–0.70)

## 2021-05-06 MED ORDER — TIZANIDINE HCL 2 MG PO TABS
4.0000 mg | ORAL_TABLET | Freq: Three times a day (TID) | ORAL | Status: DC | PRN
  Administered 2021-05-07: 4 mg via ORAL
  Filled 2021-05-06 (×2): qty 2

## 2021-05-06 MED ORDER — AMLODIPINE BESYLATE 10 MG PO TABS
10.0000 mg | ORAL_TABLET | Freq: Every day | ORAL | Status: DC
  Administered 2021-05-06 – 2021-05-08 (×3): 10 mg via ORAL
  Filled 2021-05-06 (×3): qty 1

## 2021-05-06 MED ORDER — ONDANSETRON HCL 4 MG/2ML IJ SOLN: 4.0000 mg | Freq: Four times a day (QID) | INTRAMUSCULAR | Status: DC | PRN

## 2021-05-06 MED ORDER — ZOLPIDEM TARTRATE 5 MG PO TABS
5.0000 mg | ORAL_TABLET | Freq: Every evening | ORAL | Status: DC | PRN
  Administered 2021-05-06 – 2021-05-07 (×2): 5 mg via ORAL
  Filled 2021-05-06 (×2): qty 1

## 2021-05-06 MED ORDER — IOHEXOL 350 MG/ML SOLN
100.0000 mL | Freq: Once | INTRAVENOUS | Status: AC | PRN
  Administered 2021-05-06: 75 mL via INTRAVENOUS

## 2021-05-06 MED ORDER — ONDANSETRON HCL 4 MG PO TABS: 4.0000 mg | ORAL_TABLET | Freq: Four times a day (QID) | ORAL | Status: DC | PRN

## 2021-05-06 MED ORDER — HEPARIN BOLUS VIA INFUSION
4000.0000 [IU] | Freq: Once | INTRAVENOUS | Status: AC
  Administered 2021-05-06: 4000 [IU] via INTRAVENOUS

## 2021-05-06 MED ORDER — ACETAMINOPHEN 650 MG RE SUPP: 650.0000 mg | Freq: Four times a day (QID) | RECTAL | Status: DC | PRN

## 2021-05-06 MED ORDER — HEPARIN (PORCINE) 25000 UT/250ML-% IV SOLN
1150.0000 [IU]/h | INTRAVENOUS | Status: AC
  Administered 2021-05-06 – 2021-05-08 (×3): 1150 [IU]/h via INTRAVENOUS
  Filled 2021-05-06 (×3): qty 250

## 2021-05-06 MED ORDER — MELATONIN 5 MG PO TABS
10.0000 mg | ORAL_TABLET | Freq: Every evening | ORAL | Status: DC | PRN
Start: 2021-05-06 — End: 2021-05-08

## 2021-05-06 MED ORDER — ACETAMINOPHEN 325 MG PO TABS: 650.0000 mg | ORAL_TABLET | Freq: Four times a day (QID) | ORAL | Status: DC | PRN

## 2021-05-06 MED ORDER — FAMOTIDINE 20 MG PO TABS
40.0000 mg | ORAL_TABLET | Freq: Every day | ORAL | Status: DC
  Administered 2021-05-06 – 2021-05-08 (×3): 40 mg via ORAL
  Filled 2021-05-06 (×3): qty 2

## 2021-05-06 MED ORDER — GABAPENTIN 300 MG PO CAPS
300.0000 mg | ORAL_CAPSULE | Freq: Three times a day (TID) | ORAL | Status: DC
  Administered 2021-05-06 – 2021-05-08 (×5): 300 mg via ORAL
  Filled 2021-05-06 (×5): qty 1

## 2021-05-06 MED ORDER — METOPROLOL SUCCINATE ER 50 MG PO TB24
75.0000 mg | ORAL_TABLET | Freq: Every day | ORAL | Status: DC
  Administered 2021-05-07 – 2021-05-08 (×2): 75 mg via ORAL
  Filled 2021-05-06 (×2): qty 1

## 2021-05-06 MED ORDER — HYDROCHLOROTHIAZIDE 25 MG PO TABS
25.0000 mg | ORAL_TABLET | Freq: Every day | ORAL | Status: DC
Start: 2021-05-07 — End: 2021-05-08
  Administered 2021-05-07 – 2021-05-08 (×2): 25 mg via ORAL
  Filled 2021-05-06 (×2): qty 1

## 2021-05-06 NOTE — ED Notes (Signed)
[facility] at the Bedside. 
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
## 2021-05-06 NOTE — Assessment & Plan Note (Signed)
Chronic. 

## 2021-05-06 NOTE — Assessment & Plan Note (Addendum)
?   Admitted with submassive pulmonary embolism with CT evidence of right heart strain ?? She has had venous Dopplers of upper and lower extremities that did not show any underlying DVT ?? Started on intravenous heparin with improvement of his symptoms ?? Echocardiogram showed that RV function is normal ?? She was subsequently transition to Eliquis and appeared to tolerate it well ?? She is already followed by hematology at [facility], Dr. Georgiann Cocker ?? Would likely benefit from hypercoagulable work-up once acute phase of her clot has resolved ? ? ?

## 2021-05-06 NOTE — Progress Notes (Signed)
ANTICOAGULATION CONSULT NOTE ? ?Pharmacy Consult for Heparin ?Indication: pulmonary embolus ? ?Allergies  ?Allergen Reactions  ? Amlodipine Swelling  ?  Swelling and cramping  ? Tramadol Rash  ? ? ?Patient Measurements: ?Height: 5' (152.4 cm) ?Weight: 85.7 kg (189 lb) ?IBW/kg (Calculated) : 45.5 ? ?Heparin Dosing Weight: 66 kg ? ?Vital Signs: ?Temp: 97.8 ?F (36.6 ?C) (03/29 2115) ?Temp Source: Oral (03/29 2115) ?BP: 155/85 (03/29 2115) ?Pulse Rate: 84 (03/29 2115) ? ?Labs: ?Recent Labs  ?  05/06/21 ?1115 05/06/21 ?1325 05/06/21 ?2058  ?HGB 12.0  --   --   ?HCT 37.3  --   --   ?PLT 348  --   --   ?HEPARINUNFRC  --   --  0.52  ?CREATININE 0.92  --   --   ?TROPONINIHS 3 3  --   ? ? ?Estimated Creatinine Clearance: 64 mL/min (by C-G formula based on SCr of 0.92 mg/dL). ? ? ?Assessment: ?61 year old female with a history significant for hereditary hemochromatosis presented to the ER with 10 days of worsening dyspnea on exertion found to have acute PE with right heart strain (RV/LV ratio 1.0). Pharmacy consulted to dose heparin. ? ?Heparin level therapeutic at 0.52. CBC stable, no overt s/sx bleeding reported. ? ?Goal of Therapy:  ?Heparin level 0.3-0.7 units/ml ?Monitor platelets by anticoagulation protocol: Yes ?  ?Plan:  ?Continue heparin infusion at 1150 units/hr ?Check heparin level in 6 hours and daily while on heparin ?Continue to monitor H&H and platelets ? ? ? ?Thank you for allowing pharmacy to be a part of this patient?s care. ? ?Ardyth Harps, PharmD ?Clinical Pharmacist ? ? ?

## 2021-05-06 NOTE — ED Provider Notes (Signed)
?[facility] ?Provider Note ? ? ?CSN: [id number] ?Arrival date & time: 05/06/21  1048 ? ?  ? ?History ? ?Chief Complaint  ?Patient presents with  ? Shortness of Breath  ? ? ?[name] is a 61 y.o. female. ? ? ?Shortness of Breath ? ? 61 year old female with hx of HTN, depression, HLD, fatty liver disease, who presents with roughly two weeks of dyspnea on exertion, weight gain of 10 lbs from 180 in dec to 191, increasing LLE edema (has prosthesis on the right). She denies a his of CAD, ACS, CHF. Was seen at her PCP office this week. ? ?Per outside PCP notes, "Exertional shortness of breath is ongoing x 2 weeks. She has gained weight around the abdomen; some peripheral edema at left lower leg (right BKA) and bilateral hands. She has some orthostatic dizziness. " ? ?Patient was advised to present for new dyspnea, new T wave inversions on EKG.  ? ?Home Medications ?Prior to Admission medications   ?Medication Sig Start Date End Date Taking? Authorizing Provider  ?amLODipine (NORVASC) 10 MG tablet Take 10 mg by mouth daily.   Yes [provider]  ?famotidine (PEPCID) 40 MG tablet Take 40 mg by mouth daily.   Yes [provider]  ?gabapentin (NEURONTIN) 300 MG capsule Take 1 capsule (300 mg total) by mouth 3 (three) times daily. 09/17/14  Yes Inda Castle, MD  ?hydrochlorothiazide (HYDRODIURIL) 25 MG tablet Take 25 mg by mouth daily. 05/05/21  Yes [provider]  ?metoprolol succinate (TOPROL-XL) 50 MG 24 hr tablet Take 50 mg by mouth daily. Take with or immediately following a meal.   Yes [provider]  ?tiZANidine (ZANAFLEX) 4 MG capsule Take 4 mg by mouth 3 (three) times daily.   Yes [provider]  ?zolpidem (AMBIEN) 5 MG tablet Take 5 mg by mouth at bedtime as needed for sleep.   Yes [provider]  ?acetaminophen (TYLENOL) 500 MG tablet Take 1 tablet (500 mg total) by mouth every 6 (six) hours as needed. ?Patient not taking:  Reported on 05/06/2021 10/24/20   Domenic Moras, PA-C  ?ALPRAZolam (XANAX) 0.25 MG tablet Take 1-2 30-60 minutes before MRI. May repeat if needed ?Patient not taking: Reported on 05/06/2021 04/18/17   Melvenia Beam, MD  ?methocarbamol (ROBAXIN) 500 MG tablet TAKE 1 TABLET(500 MG) BY MOUTH TWICE DAILY AS NEEDED FOR MUSCLE SPASMS ?Patient not taking: Reported on 05/06/2021 06/05/20   Jamse Arn, MD  ?ondansetron (ZOFRAN) 4 MG tablet Take 1 tablet (4 mg total) by mouth every 8 (eight) hours as needed for nausea or vomiting. 10/24/20   Domenic Moras, PA-C  ?rosuvastatin (CRESTOR) 20 MG tablet Take 1 tablet (20 mg total) by mouth daily. ?Patient not taking: Reported on 05/06/2021 09/17/14   Inda Castle, MD  ?amitriptyline (ELAVIL) 25 MG tablet Take 25 mg by mouth at bedtime.  07/19/19  [provider]  ?   ? ?Allergies    ?Amlodipine and Tramadol   ? ?Review of Systems   ?Review of Systems  ?Respiratory:  Positive for shortness of breath.   ?All other systems reviewed and are negative. ? ?Physical Exam ?Updated Vital Signs ?BP (!) 169/99   Pulse 80   Temp 98 ?F (36.7 ?C)   Resp 19   Ht 5' (1.524 m)   Wt 86 kg   SpO2 100%   BMI 37.01 kg/m?  ?Physical Exam ?Vitals and nursing note reviewed.  ?Constitutional:   ?  General: She is not in acute distress. ?   Appearance: She is well-developed.  ?HENT:  ?   Head: Normocephalic and atraumatic.  ?Eyes:  ?   Conjunctiva/sclera: Conjunctivae normal.  ?Neck:  ?   Vascular: No JVD.  ?Cardiovascular:  ?   Rate and Rhythm: Normal rate and regular rhythm.  ?   Heart sounds: No murmur heard. ?Pulmonary:  ?   Effort: Pulmonary effort is normal. No respiratory distress.  ?   Breath sounds: Normal breath sounds.  ?Abdominal:  ?   Palpations: Abdomen is soft.  ?   Tenderness: There is no abdominal tenderness.  ?Musculoskeletal:     ?   General: No swelling.  ?   Cervical back: Neck supple.  ?   Comments: RLE prosthesis present, trace pitting edema in the LLE  ?Skin: ?    General: Skin is warm and dry.  ?   Capillary Refill: Capillary refill takes less than 2 seconds.  ?Neurological:  ?   Mental Status: She is alert.  ?Psychiatric:     ?   Mood and Affect: Mood normal.  ? ? ?ED Results / Procedures / Treatments   ?Labs ?(all labs ordered are listed, but only abnormal results are displayed) ?Labs Reviewed  ?BASIC METABOLIC PANEL - Abnormal; Notable for the following components:  ?    Result Value  ? Chloride 97 (*)   ? Glucose, Bld 104 (*)   ? BUN 22 (*)   ? All other components within normal limits  ?D-DIMER, QUANTITATIVE - Abnormal; Notable for the following components:  ? D-Dimer, Quant 1.64 (*)   ? All other components within normal limits  ?RESP PANEL BY RT-PCR (FLU A&B, COVID) ARPGX2  ?CBC  ?BRAIN NATRIURETIC PEPTIDE  ?HEPARIN LEVEL (UNFRACTIONATED)  ?TROPONIN I (HIGH SENSITIVITY)  ?TROPONIN I (HIGH SENSITIVITY)  ? ? ?EKG ?EKG Interpretation ? ?Date/Time:  Wednesday May 06 2021 10:56:40 EDT ?Ventricular Rate:  94 ?PR Interval:  150 ?QRS Duration: 68 ?QT Interval:  338 ?QTC Calculation: 422 ?R Axis:   56 ?Text Interpretation: Normal sinus rhythm Nonspecific T wave abnormality Anterior T wave inversions are new compared to prior Abnormal ECG No previous ECGs available Confirmed by Regan Lemming ([id number]) on 05/06/2021 11:54:10 AM ? ?Radiology ?CT Angio Chest PE W and/or Wo Contrast ? ?Result Date: 05/06/2021 ?CLINICAL DATA:  Positive D-dimer, shortness of breath with exertion, weight gain. EXAM: CT ANGIOGRAPHY CHEST WITH CONTRAST TECHNIQUE: Multidetector CT imaging of the chest was performed using the standard protocol during bolus administration of intravenous contrast. Multiplanar CT image reconstructions and MIPs were obtained to evaluate the vascular anatomy. RADIATION DOSE REDUCTION: This exam was performed according to the departmental dose-optimization program which includes automated exposure control, adjustment of the mA and/or kV according to patient size and/or use of  iterative reconstruction technique. CONTRAST:  68m OMNIPAQUE IOHEXOL 350 MG/ML SOLN COMPARISON:  05/18/2010. FINDINGS: Cardiovascular: There filling defects in the pulmonary arterial tree on the right, with the most proximal clot seen at the lobar level. RV/LV ratio 0.96. Coronary artery calcification. Heart is enlarged. No pericardial effusion. Mediastinum/Nodes: No pathologically enlarged mediastinal, hilar or axillary lymph nodes. Esophagus is unremarkable. Lungs/Pleura: Image quality is somewhat degraded by expiratory phase imaging. Lungs are clear. No pleural fluid. Airway is unremarkable. Upper Abdomen: Liver may be slightly decreased in attenuation diffusely. Visualized portions of the liver, gallbladder, left adrenal gland, spleen, pancreas, stomach and bowel are otherwise unremarkable. Musculoskeletal: Degenerative changes in the spine. No worrisome lytic or  sclerotic lesions. Review of the MIP images confirms the above findings. IMPRESSION: 1. Positive for acute PE with CT evidence of right heart strain (RV/LV Ratio = 1.0) consistent with at least submassive (intermediate risk) PE. The presence of right heart strain has been associated with an increased risk of morbidity and mortality. Please refer to the "PE Focused" order set in EPIC. Critical Value/emergent results were called by telephone at the time of interpretation on 05/06/2021 at 1:49 pm to provider [facility] , who verbally acknowledged these results. 2. Liver may be slightly steatotic. 3. Coronary artery calcification. Electronically Signed   By: Lorin Picket M.D.   On: 05/06/2021 13:49  ? ?DG Chest Portable 1 View ? ?Result Date: 05/06/2021 ?CLINICAL DATA:  Provided history: Shortness of breath. Abnormal EKG. History of hypertension. EXAM: PORTABLE CHEST 1 VIEW COMPARISON:  CT angiogram chest 05/18/2010. Prior chest radiographs 05/22/2004. FINDINGS: Heart size within normal limits. No appreciable airspace consolidation. No evidence of  pleural effusion or pneumothorax. No acute bony abnormality identified. IMPRESSION: No evidence of active cardiopulmonary disease. Electronically Signed   By: Kellie Simmering D.O.   On: 05/06/2021 11:16   ? ?Procedures ?Marland KitchenC

## 2021-05-06 NOTE — ED Triage Notes (Signed)
Pt presents POV with SOB x1 week, increase SOB w/exertion, 6 lb weight gain and generalized swelling. States everything feels "tight". Pt reports she is supposed to be losing weight d/t "liver problems":  ?

## 2021-05-06 NOTE — Assessment & Plan Note (Signed)
Continue hctz/lopressor ?

## 2021-05-06 NOTE — Subjective & Objective (Signed)
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  Patient states that she not had any chest pain.  She has noticed increased dyspnea on exertion with even walking from her house to her mailbox.  She states that she cannot go from the parking lot to the grocery store entrance without feeling extremely winded.  No recent fever, chills.  No diarrhea. ? ?Again patient denies any chest pain associated with her shortness of breath. ? ?She has noted at least a 10 pound weight gain in what she describes as water weight. ? ?She denies ever having a history of DVTs or pulmonary embolisms. ? ?She does not smoke.  She takes normal oral contraceptives.  She is status post abdominal hysterectomy. ? ?On arrival to the ER, temp 98, heart rate 92, blood pressure 173/103, satting 98% on room air. ? ?D-dimer is elevated at 1.64 ?Sodium 137, CO2 31, BUN 22, creatinine 0.9 ? ?White count 8.5, hemoglobin 12.0, platelets 348 ? ?BNP normal at 24.3. ? ?CTPA demonstrated a pulmonary embolism in the right proximal pulmonary artery.  Heart is enlarged. ? ?Due to the patient's PE, patient transferred to [facility]. ?

## 2021-05-06 NOTE — Assessment & Plan Note (Signed)
Continue statin. 
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
## 2021-05-06 NOTE — Progress Notes (Signed)
Plan of Care Note for accepted transfer ? ? ?Patient: [name] MRN: [id number]   DOA: 05/06/2021 ? ?Facility requesting transfer: [facility] ?Requesting Provider: Dr. Armandina Gemma ?Reason for transfer: PE ?Facility course: 61 year old with hx of HTN, HLD, IBS, depression, prediabetes who presented to ED with complaints of shortness of breath x 1 week, 6lb. Weight gain and generalized swelling. Denies any chest pain, cough.  ? ?Vitals: stable, on room air ?Labs: d-dimer: 1.64, troponin wnl x 2, ekg: new T wave inversion on ekg.  ?CXR: wnl ?CTA chest: +acute PE with CT evidence of right heart strain.  ?Started on heparin gtt.  ?Will need echo/dopplers.  ? ?Plan of care: ?The patient is accepted for admission to Progressive unit, at [facility]..  ? ? ?Author: ?Orma Flaming, MD ?05/06/2021 ? ?Check [email or web address] for on-call coverage. ? ?Nursing staff, Please call [place] number on Amion as soon as patient's arrival, so appropriate admitting provider can evaluate the pt. ?

## 2021-05-06 NOTE — Assessment & Plan Note (Addendum)
Pt gets phlebotomy every 8 weeks. Last phlebotomy 04-08-2021. ?

## 2021-05-07 ENCOUNTER — Observation Stay (HOSPITAL_BASED_OUTPATIENT_CLINIC_OR_DEPARTMENT_OTHER): Payer: Medicare HMO

## 2021-05-07 ENCOUNTER — Other Ambulatory Visit (HOSPITAL_COMMUNITY): Payer: Self-pay

## 2021-05-07 DIAGNOSIS — I2699 Other pulmonary embolism without acute cor pulmonale: Secondary | ICD-10-CM

## 2021-05-07 DIAGNOSIS — M7989 Other specified soft tissue disorders: Secondary | ICD-10-CM | POA: Diagnosis not present

## 2021-05-07 DIAGNOSIS — I2609 Other pulmonary embolism with acute cor pulmonale: Secondary | ICD-10-CM

## 2021-05-07 DIAGNOSIS — Z89511 Acquired absence of right leg below knee: Secondary | ICD-10-CM | POA: Diagnosis not present

## 2021-05-07 DIAGNOSIS — E785 Hyperlipidemia, unspecified: Secondary | ICD-10-CM | POA: Diagnosis not present

## 2021-05-07 LAB — CBC WITH DIFFERENTIAL/PLATELET
Abs Immature Granulocytes: 0.04 10*3/uL (ref 0.00–0.07)
Basophils Absolute: 0 10*3/uL (ref 0.0–0.1)
Basophils Relative: 1 %
Eosinophils Absolute: 0.3 10*3/uL (ref 0.0–0.5)
Eosinophils Relative: 3 %
HCT: 35.3 % — ABNORMAL LOW (ref 36.0–46.0)
Hemoglobin: 11.5 g/dL — ABNORMAL LOW (ref 12.0–15.0)
Immature Granulocytes: 1 %
Lymphocytes Relative: 43 %
Lymphs Abs: 3.5 10*3/uL (ref 0.7–4.0)
MCH: 30.2 pg (ref 26.0–34.0)
MCHC: 32.6 g/dL (ref 30.0–36.0)
MCV: 92.7 fL (ref 80.0–100.0)
Monocytes Absolute: 0.7 10*3/uL (ref 0.1–1.0)
Monocytes Relative: 8 %
Neutro Abs: 3.7 10*3/uL (ref 1.7–7.7)
Neutrophils Relative %: 44 %
Platelets: 318 10*3/uL (ref 150–400)
RBC: 3.81 MIL/uL — ABNORMAL LOW (ref 3.87–5.11)
RDW: 13.5 % (ref 11.5–15.5)
WBC: 8.2 10*3/uL (ref 4.0–10.5)
nRBC: 0 % (ref 0.0–0.2)

## 2021-05-07 LAB — COMPREHENSIVE METABOLIC PANEL
ALT: 53 U/L — ABNORMAL HIGH (ref 0–44)
AST: 58 U/L — ABNORMAL HIGH (ref 15–41)
Albumin: 3.4 g/dL — ABNORMAL LOW (ref 3.5–5.0)
Alkaline Phosphatase: 83 U/L (ref 38–126)
Anion gap: 12 (ref 5–15)
BUN: 17 mg/dL (ref 6–20)
CO2: 24 mmol/L (ref 22–32)
Calcium: 8.9 mg/dL (ref 8.9–10.3)
Chloride: 101 mmol/L (ref 98–111)
Creatinine, Ser: 0.8 mg/dL (ref 0.44–1.00)
GFR, Estimated: >60 mL/min (ref >60)
Glucose, Bld: 130 mg/dL — ABNORMAL HIGH (ref 70–99)
Potassium: 3.3 mmol/L — ABNORMAL LOW (ref 3.5–5.1)
Sodium: 137 mmol/L (ref 135–145)
Total Bilirubin: 0.5 mg/dL (ref 0.3–1.2)
Total Protein: 6.7 g/dL (ref 6.5–8.1)

## 2021-05-07 LAB — MAGNESIUM: Magnesium: 2 mg/dL (ref 1.7–2.4)

## 2021-05-07 LAB — ECHOCARDIOGRAM COMPLETE
Area-P 1/2: 4.86 cm2
Height: 60 in
S' Lateral: 2.5 cm
Weight: 2948.87 oz

## 2021-05-07 LAB — GLUCOSE, CAPILLARY: Glucose-Capillary: 160 mg/dL — ABNORMAL HIGH (ref 70–99)

## 2021-05-07 LAB — HIV ANTIBODY (ROUTINE TESTING W REFLEX): HIV Screen 4th Generation wRfx: NONREACTIVE

## 2021-05-07 LAB — HEPARIN LEVEL (UNFRACTIONATED): Heparin Unfractionated: 0.52 IU/mL (ref 0.30–0.70)

## 2021-05-07 NOTE — Progress Notes (Signed)
VASCULAR LAB ? ? ? ?Bilateral upper extremity venous duplex has been performed. ? ?See CV proc for preliminary results. ? ? ?Jullianna Gabor, RVT ?05/07/2021, 10:04 AM ? ?

## 2021-05-07 NOTE — TOC Benefit Eligibility Note (Signed)
Patient Advocate Encounter  Insurance verification completed.    The patient is currently admitted and upon discharge could be taking Eliquis 5 mg.  The current 30 day co-pay is, $45.00.   The patient is currently admitted and upon discharge could be taking Xarelto 20 mg.  The current 30 day co-pay is, $45.00.   The patient is insured through [employer] Part D     Corinthian Kemler, CPhT Pharmacy Patient Advocate Specialist [facility] Patient Advocate Team Direct Number: [phone]  Fax: [phone]        

## 2021-05-07 NOTE — Progress Notes (Signed)
VASCULAR LAB ? ? ? ?Bilateral lower extremity venous duplex has been performed. ? ?See CV proc for preliminary results. ? ? ?Vickie Ponds, RVT ?05/07/2021, 10:03 AM ? ?
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
## 2021-05-07 NOTE — Hospital Course (Signed)
61 year old female with a history of hemochromatosis, admitted to the hospital with progressive dyspnea on exertion.  Found to have submassive pulmonary embolism on CT imaging.  Started on IV heparin. ?

## 2021-05-07 NOTE — Progress Notes (Signed)
?  Progress Note ? ? ?Patient: [name] ZOX:[id number] DOB: 14-Oct-1960 DOA: 05/06/2021     0 ?DOS: the patient was seen and examined on 05/07/2021 ?  ?Brief hospital course: ?61 year old female with a history of hemochromatosis, admitted to the hospital with progressive dyspnea on exertion.  Found to have submassive pulmonary embolism on CT imaging.  Started on IV heparin. ? ?Assessment and Plan: ?* Pulmonary embolism ([place]) ?Admitted with submassive pulmonary embolism with CT evidence of right heart strain ?She has had venous Dopplers of upper and lower extremities that did not show any underlying DVT ?Started on intravenous heparin with some improvement of her symptoms ?Would continue for another 24 hours ?Echocardiogram showed that RV function is normal ?Plan on transitioning to Eliquis versus Xarelto.  Will request TOC to perform benefits check ?She is already followed by hematology at [facility], Dr. Georgiann Cocker ?Would likely benefit from hypercoagulable work-up once acute phase of her clot has resolved ?We will encourage ambulation, if she can ambulate without significant shortness of breath, anticipate discharge home in the next 24 hours. ? ? ?Hx of right BKA ([place]) ?Chronic. ? ?Hereditary hemochromatosis ([place]) ?Pt gets phlebotomy every 8 weeks. Last phlebotomy 04-08-2021. ? ?Hyperlipidemia ?Continue statin ? ?Hypertension ?Continue hctz/lopressor ? ? ? ? ?  ? ?Subjective: Patient reports that she is feeling better.  Overall dyspnea on exertion is improving ? ?Physical Exam: ?Vitals:  ? 05/07/21 0021 05/07/21 0400 05/07/21 0844 05/07/21 1123  ?BP: (!) 154/86 135/70 (!) 156/93 138/86  ?Pulse: 88 90 100 100  ?Resp: (!) 22 15 (!) 25 17  ?Temp: 97.9 ?F (36.6 ?C) 97.6 ?F (36.4 ?C)  97.7 ?F (36.5 ?C)  ?TempSrc: Oral Oral Oral Oral  ?SpO2: 93% 91% 95%   ?Weight:  83.6 kg    ?Height:      ? ?General exam: Alert, awake, oriented x 3 ?Respiratory system: Clear to auscultation. Respiratory effort normal. ?Cardiovascular  system:RRR. No murmurs, rubs, gallops. ?Gastrointestinal system: Abdomen is nondistended, soft and nontender. No organomegaly or masses felt. Normal bowel sounds heard. ?Central nervous system: Alert and oriented. No focal neurological deficits. ?Extremities: Right below the knee amputation ?Skin: No rashes, lesions or ulcers ?Psychiatry: Judgement and insight appear normal. Mood & affect appropriate.  ? ?Data Reviewed: ? ?Reviewed CT chest with submassive PE, CBC and chemistry ? ?Family Communication: Patient's sister-in-law at the bedside ? ?Disposition: ?Status is: Observation ?The patient remains OBS appropriate and will d/c before 2 midnights. ? Planned Discharge Destination: Home ? ? ? ?Time spent: 35 minutes ? ?Author: ?Kathie Dike, MD ?05/07/2021 6:13 PM ? ?For on call review [email or web address].  ?

## 2021-05-07 NOTE — Progress Notes (Signed)
?  Echocardiogram ?2D Echocardiogram has been performed. ? ?[name] ?05/07/2021, 2:33 PM ?

## 2021-05-08 ENCOUNTER — Other Ambulatory Visit (HOSPITAL_COMMUNITY): Payer: Self-pay

## 2021-05-08 DIAGNOSIS — I2699 Other pulmonary embolism without acute cor pulmonale: Secondary | ICD-10-CM | POA: Diagnosis not present

## 2021-05-08 DIAGNOSIS — Z89511 Acquired absence of right leg below knee: Secondary | ICD-10-CM | POA: Diagnosis not present

## 2021-05-08 DIAGNOSIS — E785 Hyperlipidemia, unspecified: Secondary | ICD-10-CM | POA: Diagnosis not present

## 2021-05-08 LAB — CBC
HCT: 35 % — ABNORMAL LOW (ref 36.0–46.0)
Hemoglobin: 11.7 g/dL — ABNORMAL LOW (ref 12.0–15.0)
MCH: 30.8 pg (ref 26.0–34.0)
MCHC: 33.4 g/dL (ref 30.0–36.0)
MCV: 92.1 fL (ref 80.0–100.0)
Platelets: 342 10*3/uL (ref 150–400)
RBC: 3.8 MIL/uL — ABNORMAL LOW (ref 3.87–5.11)
RDW: 13.6 % (ref 11.5–15.5)
WBC: 8.8 10*3/uL (ref 4.0–10.5)
nRBC: 0 % (ref 0.0–0.2)

## 2021-05-08 LAB — HEPARIN LEVEL (UNFRACTIONATED): Heparin Unfractionated: 0.47 IU/mL (ref 0.30–0.70)

## 2021-05-08 MED ORDER — APIXABAN 5 MG PO TABS
5.0000 mg | ORAL_TABLET | Freq: Two times a day (BID) | ORAL | 1 refills | Status: DC
  Filled 2021-05-08: qty 60, 30d supply, fill #0

## 2021-05-08 MED ORDER — POTASSIUM CHLORIDE CRYS ER 20 MEQ PO TBCR
40.0000 meq | EXTENDED_RELEASE_TABLET | Freq: Once | ORAL | Status: AC
  Administered 2021-05-08: 40 meq via ORAL
  Filled 2021-05-08: qty 2

## 2021-05-08 MED ORDER — APIXABAN 5 MG PO TABS: 5.0000 mg | ORAL_TABLET | Freq: Two times a day (BID) | ORAL | Status: DC

## 2021-05-08 MED ORDER — METOPROLOL SUCCINATE ER 50 MG PO TB24
75.0000 mg | ORAL_TABLET | Freq: Every day | ORAL | 0 refills | Status: DC
  Filled 2021-05-08: qty 45, 30d supply, fill #0

## 2021-05-08 MED ORDER — APIXABAN 5 MG PO TABS
10.0000 mg | ORAL_TABLET | Freq: Two times a day (BID) | ORAL | Status: DC
  Administered 2021-05-08: 10 mg via ORAL
  Filled 2021-05-08: qty 2

## 2021-05-08 MED ORDER — APIXABAN (ELIQUIS) VTE STARTER PACK (10MG AND 5MG)
ORAL_TABLET | ORAL | 0 refills | Status: DC
  Filled 2021-05-08: qty 74, 30d supply, fill #0

## 2021-05-08 NOTE — Discharge Instructions (Signed)
Information on my medicine - ELIQUIS? (apixaban) ? ?This medication education was reviewed with me or my healthcare representative as part of my discharge preparation.    ? ?Why was Eliquis? prescribed for you? ?Eliquis? was prescribed to treat blood clots that may have been found in the veins of your legs (deep vein thrombosis) or in your lungs (pulmonary embolism) and to reduce the risk of them occurring again. ? ?What do You need to know about Eliquis? ? ?The starting dose is 10 mg (two 5 mg tablets) taken TWICE daily for the FIRST SEVEN (7) DAYS, then on    05/14/21  the dose is reduced to ONE 5 mg tablet taken TWICE daily.  Eliquis? may be taken with or without food.  ? ?Try to take the dose about the same time in the morning and in the evening. If you have difficulty swallowing the tablet whole please discuss with your pharmacist how to take the medication safely. ? ?Take Eliquis? exactly as prescribed and DO NOT stop taking Eliquis? without talking to the doctor who prescribed the medication.  Stopping may increase your risk of developing a new blood clot.  Refill your prescription before you run out. ? ?After discharge, you should have regular check-up appointments with your healthcare provider that is prescribing your Eliquis?. ?   ?What do you do if you miss a dose? ?If a dose of ELIQUIS? is not taken at the scheduled time, take it as soon as possible on the same day and twice-daily administration should be resumed. The dose should not be doubled to make up for a missed dose. ? ?Important Safety Information ?A possible side effect of Eliquis? is bleeding. You should call your healthcare provider right away if you experience any of the following: ?Bleeding from an injury or your nose that does not stop. ?Unusual colored urine (red or dark brown) or unusual colored stools (red or black). ?Unusual bruising for unknown reasons. ?A serious fall or if you hit your head (even if there is no bleeding). ? ?Some  medicines may interact with Eliquis? and might increase your risk of bleeding or clotting while on Eliquis?Marland Kitchen To help avoid this, consult your healthcare provider or pharmacist prior to using any new prescription or non-prescription medications, including herbals, vitamins, non-steroidal anti-inflammatory drugs (NSAIDs) and supplements. ? ?This website has more information on Eliquis? (apixaban): http://www.eliquis.com/eliquis/home ? ?========================================================================== ? ?Pulmonary Embolism ? ?  ?A pulmonary embolism (PE) is a sudden blockage or decrease of blood flow in one or both lungs. Most blockages come from a blood clot that forms in the vein of a lower leg, thigh, or arm (deep vein thrombosis, DVT) and travels to the lungs. A clot is blood that has thickened into a gel or solid. PE is a dangerous and life-threatening condition that needs to be treated right away. ? ?What are the causes? ?This condition is usually caused by a blood clot that forms in a vein and moves to the lungs. In rare cases, it may be caused by air, fat, part of a tumor, or other tissue that moves through the veins and into the lungs. ? ?What increases the risk? ?The following factors may make you more likely to develop this condition: ?Experiencing a traumatic injury, such as breaking a hip or leg. ?Having: ?A spinal cord injury. ?Orthopedic surgery, especially hip or knee replacement. ?Any major surgery. ?A stroke. ?DVT. ?Blood clots or blood clotting disease. ?Long-term (chronic) lung or heart disease. ?Cancer treated with chemotherapy. ?A  central venous catheter. ?Taking medicines that contain estrogen. These include birth control pills and hormone replacement therapy. ?Being: ?Pregnant. ?In the period of time after your baby is delivered (postpartum). ?Older than age 67. ?Overweight. ?A smoker, especially if you have other risks. ? ?What are the signs or symptoms? ?Symptoms of this condition  usually start suddenly and include: ?Shortness of breath during activity or at rest. ?Coughing, coughing up blood, or coughing up blood-tinged mucus. ?Chest pain that is often worse with deep breaths. ?Rapid or irregular heartbeat. ?Feeling light-headed or dizzy. ?Fainting. ?Feeling anxious. ?Fever. ?Sweating. ?Pain and swelling in a leg. This is a symptom of DVT, which can lead to PE. ?How is this diagnosed? ?This condition may be diagnosed based on: ?Your medical history. ?A physical exam. ?Blood tests. ?CT pulmonary angiogram. This test checks blood flow in and around your lungs. ?Ventilation-perfusion scan, also called a lung VQ scan. This test measures air flow and blood flow to the lungs. ?An ultrasound of the legs. ? ?How is this treated? ?Treatment for this condition depends on many factors, such as the cause of your PE, your risk for bleeding or developing more clots, and other medical conditions you have. Treatment aims to remove, dissolve, or stop blood clots from forming or growing larger. Treatment may include: ?Medicines, such as: ?Blood thinning medicines (anticoagulants) to stop clots from forming. ?Medicines that dissolve clots (thrombolytics). ?Procedures, such as: ?Using a flexible tube to remove a blood clot (embolectomy) or to deliver medicine to destroy it (catheter-directed thrombolysis). ?Inserting a filter into a large vein that carries blood to the heart (inferior vena cava). This filter (vena cava filter) catches blood clots before they reach the lungs. ?Surgery to remove the clot (surgical embolectomy). This is rare. ?You may need a combination of immediate, long-term (up to 3 months after diagnosis), and extended (more than 3 months after diagnosis) treatments. Your treatment may continue for several months (maintenance therapy). You and your health care provider will work together to choose the treatment program that is best for you. ? ?Follow these instructions at  home: ?Medicines ?Take over-the-counter and prescription medicines only as told by your health care provider. ?If you are taking an anticoagulant medicine: ?Take the medicine every day at the same time each day. ?Understand what foods and drugs interact with your medicine. ?Understand the side effects of this medicine, including excessive bruising or bleeding. Ask your health care provider or pharmacist about other side effects. ? ?General instructions ?Wear a medical alert bracelet or carry a medical alert card that says you have had a PE and lists what medicines you take. ?Ask your health care provider when you may return to your normal activities. Avoid sitting or lying for a long time without moving. ?Maintain a healthy weight. Ask your health care provider what weight is healthy for you. ?Do not use any products that contain nicotine or tobacco, such as cigarettes, e-cigarettes, and chewing tobacco. If you need help quitting, ask your health care provider. ?Talk with your health care provider about any travel plans. It is important to make sure that you are still able to take your medicine while on trips. ?Keep all follow-up visits as told by your health care provider. This is important. ? ?Contact a health care provider if: ?You missed a dose of your blood thinner medicine. ? ?Get help right away if: ?You have: ?New or increased pain, swelling, warmth, or redness in an arm or leg. ?Numbness or tingling in an arm  or leg. ?Shortness of breath during activity or at rest. ?A fever. ?Chest pain. ?A rapid or irregular heartbeat. ?A severe headache. ?Vision changes. ?A serious fall or accident, or you hit your head. ?Stomach (abdominal) pain. ?Blood in your vomit, stool, or urine. ?A cut that will not stop bleeding. ?You cough up blood. ?You feel light-headed or dizzy. ?You cannot move your arms or legs. ?You are confused or have memory loss. ? ?These symptoms may represent a serious problem that is an emergency. Do  not wait to see if the symptoms will go away. Get medical help right away. Call your local emergency services (911 in the [place] Do not drive yourself to the hospital. ?Summary ?A pulmonary embolism (PE) is a sudden blockage or d

## 2021-05-08 NOTE — Discharge Summary (Signed)
?Physician Discharge Summary ?  ?Patient: [name] MRN: [id number] DOB: [birth year]  ?Admit date:     05/06/2021  ?Discharge date: 05/08/21  ?Discharge Physician: Kathie Dike  ? ?PCP: Pieter Partridge, PA  ? ?Recommendations at discharge:  ? ?Follow-up with hematologist, Dr. Georgiann Cocker to discuss length of anticoagulation therapy and hypercoagulable work-up ? ?Discharge Diagnoses: ?Principal Problem: ?  Pulmonary embolism ([place]) ?Active Problems: ?  Hypertension ?  Hyperlipidemia ?  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  Hx of right BKA ([place]) ? ?Resolved Problems: ?  * No resolved hospital problems. * ? ?Hospital Course: ?61 year old female with a history of hemochromatosis, admitted to the hospital with progressive dyspnea on exertion.  Found to have submassive pulmonary embolism on CT imaging.  Started on IV heparin. ? ?Assessment and Plan: ?* Pulmonary embolism ([place]) ?Admitted with submassive pulmonary embolism with CT evidence of right heart strain ?She has had venous Dopplers of upper and lower extremities that did not show any underlying DVT ?Started on intravenous heparin with improvement of his symptoms ?Echocardiogram showed that RV function is normal ?She was subsequently transition to Eliquis and appeared to tolerate it well ?She is already followed by hematology at [facility], Dr. Georgiann Cocker ?Would likely benefit from hypercoagulable work-up once acute phase of her clot has resolved ? ? ? 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 ? ?Hereditary hemochromatosis ([place]) ?Pt gets phlebotomy every 8 weeks. Last phlebotomy 04-08-2021. ? ?Hyperlipidemia ?Continue statin ? ?Hypertension ?Continue hctz/lopressor ? ? ? ? ?  ? ? ?Consultants:  ?Procedures performed:   ?Disposition: Home ?Diet recommendation:  ?Discharge Diet Orders (From admission, onward)  ? ?  Start     Ordered  ? 05/08/21 0000  Diet - low sodium heart healthy       ? 05/08/21 1625  ? ?  ?  ? ?  ? ?Cardiac diet ?DISCHARGE MEDICATION: ?Allergies as of 05/08/2021   ? ?    Reactions  ? Amlodipine Swelling  ? Swelling and cramping  ? Tramadol Rash  ? ?  ? ?  ?Medication List  ?  ? ?STOP taking these medications   ? ?ALPRAZolam 0.25 MG tablet ?Commonly known as: Xanax ?  ? ?  ? ?TAKE these medications   ? ?amLODipine 10 MG tablet ?Commonly known as: NORVASC ?Take 10 mg by mouth daily. ?  ?Eliquis DVT/PE Starter Pack ?Generic drug: Apixaban Starter Pack ('10mg'$  and '5mg'$ ) ?Take as directed on package: start with two-'5mg'$  tablets twice daily for 7 days. On day 8, switch to one-'5mg'$  tablet twice daily. ?  ?apixaban 5 MG Tabs tablet ?Commonly known as: ELIQUIS ?Take 1 tablet (5 mg total) by mouth 2 (two) times daily. To be started once starter pack is complete ?  ?famotidine 40 MG tablet ?Commonly known as: PEPCID ?Take 40 mg by mouth daily. ?  ?gabapentin 300 MG capsule ?Commonly known as: NEURONTIN ?Take 1 capsule (300 mg total) by mouth 3 (three) times daily. ?  ?hydrochlorothiazide 25 MG tablet ?Commonly known as: HYDRODIURIL ?Take 25 mg by mouth daily. ?  ?metoprolol succinate 50 MG 24 hr tablet ?Commonly known as: TOPROL-XL ?Take 1.5 tablets (75 mg total) by mouth daily. Take with or immediately following a meal. ?What changed: how much to take ?  ?ondansetron 4 MG tablet ?Commonly known as: ZOFRAN ?Take 1 tablet (4 mg total) by mouth every 8 (eight) hours as needed for nausea or vomiting. ?  ?tiZANidine 4 MG capsule ?Commonly known as: ZANAFLEX ?Take 4 mg by mouth  3 (three) times daily. ?  ?zolpidem 5 MG tablet ?Commonly known as: AMBIEN ?Take 5 mg by mouth at bedtime as needed for sleep. ?  ? ?  ? ? Follow-up Information   ? ? Pieter Partridge, PA Follow up.   ?Specialty: Family Medicine ?Contact information: ?[address] [place] ?[place] [address] ?[phone] ? ? ?  ?  ? ?  ?  ? ?  ? ?Discharge Exam: ?Filed Weights  ? 05/06/21 2115 05/07/21 0400 05/08/21 0400  ?Weight: 85.7 kg 83.6 kg 84.6 kg  ? ?General exam: Alert, awake, oriented x 3 ?Respiratory system: Clear to  auscultation. Respiratory effort normal. ?Cardiovascular system:RRR. No murmurs, rubs, gallops. ?Gastrointestinal system: Abdomen is nondistended, soft and nontender. No organomegaly or masses felt. Normal bowel sounds heard. ?Central nervous system: Alert and oriented. No focal neurological deficits. ?Extremities: Right BKA ?Skin: No rashes, lesions or ulcers ?Psychiatry: Judgement and insight appear normal. Mood & affect appropriate.  ? ? ?Condition at discharge: good ? ?The results of significant diagnostics from this hospitalization (including imaging, microbiology, ancillary and laboratory) are listed below for reference.  ? ?Imaging Studies: ?CT Angio Chest PE W and/or Wo Contrast ? ?Result Date: 05/06/2021 ?CLINICAL DATA:  Positive D-dimer, shortness of breath with exertion, weight gain. EXAM: CT ANGIOGRAPHY CHEST WITH CONTRAST TECHNIQUE: Multidetector CT imaging of the chest was performed using the standard protocol during bolus administration of intravenous contrast. Multiplanar CT image reconstructions and MIPs were obtained to evaluate the vascular anatomy. RADIATION DOSE REDUCTION: This exam was performed according to the departmental dose-optimization program which includes automated exposure control, adjustment of the mA and/or kV according to patient size and/or use of iterative reconstruction technique. CONTRAST:  38m OMNIPAQUE IOHEXOL 350 MG/ML SOLN COMPARISON:  05/18/2010. FINDINGS: Cardiovascular: There filling defects in the pulmonary arterial tree on the right, with the most proximal clot seen at the lobar level. RV/LV ratio 0.96. Coronary artery calcification. Heart is enlarged. No pericardial effusion. Mediastinum/Nodes: No pathologically enlarged mediastinal, hilar or axillary lymph nodes. Esophagus is unremarkable. Lungs/Pleura: Image quality is somewhat degraded by expiratory phase imaging. Lungs are clear. No pleural fluid. Airway is unremarkable. Upper Abdomen: Liver may be slightly  decreased in attenuation diffusely. Visualized portions of the liver, gallbladder, left adrenal gland, spleen, pancreas, stomach and bowel are otherwise unremarkable. Musculoskeletal: Degenerative changes in the spine. No worrisome lytic or sclerotic lesions. Review of the MIP images confirms the above findings. IMPRESSION: 1. Positive for acute PE with CT evidence of right heart strain (RV/LV Ratio = 1.0) consistent with at least submassive (intermediate risk) PE. The presence of right heart strain has been associated with an increased risk of morbidity and mortality. Please refer to the "PE Focused" order set in EPIC. Critical Value/emergent results were called by telephone at the time of interpretation on 05/06/2021 at 1:49 pm [facility], who verbally acknowledged these results. 2. Liver may be slightly steatotic. 3. Coronary artery calcification. Electronically Signed   By: MLorin PicketM.D.   On: 05/06/2021 13:49  ? ?DG Chest Portable 1 View ? ?Result Date: 05/06/2021 ?CLINICAL DATA:  Provided history: Shortness of breath. Abnormal EKG. History of hypertension. EXAM: PORTABLE CHEST 1 VIEW COMPARISON:  CT angiogram chest 05/18/2010. Prior chest radiographs 05/22/2004. FINDINGS: Heart size within normal limits. No appreciable airspace consolidation. No evidence of pleural effusion or pneumothorax. No acute bony abnormality identified. IMPRESSION: No evidence of active cardiopulmonary disease. Electronically Signed   By: KKellie SimmeringD.O.  On: 05/06/2021 11:16  ? ?ECHOCARDIOGRAM COMPLETE ? ?Result Date: 05/07/2021 ?   ECHOCARDIOGRAM REPORT   Patient Name:   [name] Date of Exam: 05/07/2021 Medical Rec #:  [id number]      Height:       60.0 in Accession #:    [id number]     Weight:       184.3 lb Date of Birth:  April 15, 1960      BSA:          1.803 m? Patient Age:    80 years       BP:           156/93 mmHg Patient Gender: F              HR:           90 bpm. Exam Location:  Inpatient Procedure:  2D Echo Indications:    pulmonary embolus  History:        Patient has no prior history of Echocardiogram examinations.                 Risk Factors:Hypertension and Dyslipidemia.  Sonographer:    Johny Chess
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
## 2021-05-08 NOTE — Progress Notes (Signed)
Mobility Specialist Progress Note: ? ? 05/08/21 1100  ?Mobility  ?Activity Ambulated with assistance in hallway  ?Level of Assistance Independent  ?Assistive Device None  ?Distance Ambulated (ft) 470 ft  ?Activity Response Tolerated well  ?$Mobility charge 1 Mobility  ? ?During Mobility: SpO2 92% RA; HR 120bpm ?Post Mobility: SpO2 97% RA; HR 94bpm ? ?Pt eager for mobility. Pt with R BKA, wears prosthetic. No LOB or SOB noted, pt back in bed with all needs met.  ? ?  ?Acute Rehab ?Phone: [phone] ?Office Phone: [phone] ? ?

## 2021-05-08 NOTE — TOC Transition Note (Addendum)
Transition of Care (TOC) - CM/SW Discharge Note ? ? ?Patient Details  ?Name: [name] ?MRN: [id number] ?Date of Birth: 08/11/1960 ? ?Transition of Care (TOC) CM/SW Contact:  ?Zenon Mayo, RN ?Phone Number: ?05/08/2021, 1:04 PM ? ? ?Clinical Narrative:    ?Patient is from home, here with PE, heparin is being changed to eliquis.  TOC pharmacy to fill eliquis for her , NCM asked MD to see to [facility], NCM will give her the other 10.00 copay card for eliquis, sinc toc will fill the first 30 days free. Patient states her spouse will transport her home at discharge.  ? ? ?  ?  ? ? ?Patient Goals and CMS Choice ?  ?  ?  ? ?Discharge Placement ?  ?           ?  ?  ?  ?  ? ?Discharge Plan and Services ?  ?  ?           ?  ?  ?  ?  ?  ?  ?  ?  ?  ?  ? ?Social Determinants of Health (SDOH) Interventions ?  ? ? ?Readmission Risk Interventions ?   ? View : No data to display.  ?  ?  ?  ? ? ? ? ? ?

## 2021-05-13 DIAGNOSIS — D72821 Monocytosis (symptomatic): Secondary | ICD-10-CM | POA: Insufficient documentation

## 2021-05-13 DIAGNOSIS — D75839 Thrombocytosis, unspecified: Secondary | ICD-10-CM | POA: Insufficient documentation

## 2021-05-15 ENCOUNTER — Telehealth (HOSPITAL_COMMUNITY): Payer: Self-pay

## 2021-05-15 NOTE — Telephone Encounter (Signed)
Transitions of Care Pharmacy  ° °Call attempted for a pharmacy transitions of care follow-up. HIPAA appropriate voicemail was left with call back information provided.  ° °Call attempt #1. Will follow-up in 2-3 days.  °  °

## 2021-05-19 ENCOUNTER — Telehealth (HOSPITAL_COMMUNITY): Payer: Self-pay

## 2021-05-19 NOTE — Telephone Encounter (Signed)
Transitions of Care Pharmacy   Call attempted for a pharmacy transitions of care follow-up. HIPAA appropriate voicemail was left with call back information provided.   Call attempt #2. Will follow-up in 2-3 days.    

## 2021-05-20 ENCOUNTER — Telehealth (HOSPITAL_COMMUNITY): Payer: Self-pay

## 2021-05-20 NOTE — Telephone Encounter (Signed)
Transitions of Care Pharmacy   Call attempted for a pharmacy transitions of care follow-up. HIPAA appropriate voicemail was left with call back information provided.   Call attempt #3. Will no longer attempt follow up for TOC pharmacy.   

## 2021-07-22 ENCOUNTER — Other Ambulatory Visit: Payer: Self-pay | Admitting: Family Medicine

## 2021-07-22 DIAGNOSIS — Z1231 Encounter for screening mammogram for malignant neoplasm of breast: Secondary | ICD-10-CM

## 2021-08-01 ENCOUNTER — Emergency Department (HOSPITAL_COMMUNITY): Payer: Medicare HMO

## 2021-08-01 ENCOUNTER — Encounter (HOSPITAL_COMMUNITY): Payer: Self-pay

## 2021-08-01 ENCOUNTER — Observation Stay (HOSPITAL_COMMUNITY)
Admission: EM | Admit: 2021-08-01 | Discharge: 2021-08-02 | Disposition: A | Discharge status: Home / Self Care | Payer: Medicare HMO | Attending: Family Medicine | Admitting: Family Medicine

## 2021-08-01 DIAGNOSIS — R000 Tachycardia, unspecified: Secondary | ICD-10-CM | POA: Insufficient documentation

## 2021-08-01 DIAGNOSIS — R7989 Other specified abnormal findings of blood chemistry: Secondary | ICD-10-CM | POA: Insufficient documentation

## 2021-08-01 DIAGNOSIS — M7989 Other specified soft tissue disorders: Secondary | ICD-10-CM | POA: Diagnosis not present

## 2021-08-01 DIAGNOSIS — F419 Anxiety disorder, unspecified: Secondary | ICD-10-CM | POA: Diagnosis not present

## 2021-08-01 DIAGNOSIS — Z833 Family history of diabetes mellitus: Secondary | ICD-10-CM | POA: Insufficient documentation

## 2021-08-01 DIAGNOSIS — R1310 Dysphagia, unspecified: Secondary | ICD-10-CM | POA: Insufficient documentation

## 2021-08-01 DIAGNOSIS — R9431 Abnormal electrocardiogram [ECG] [EKG]: Secondary | ICD-10-CM | POA: Insufficient documentation

## 2021-08-01 DIAGNOSIS — K760 Fatty (change of) liver, not elsewhere classified: Secondary | ICD-10-CM | POA: Diagnosis not present

## 2021-08-01 DIAGNOSIS — R531 Weakness: Principal | ICD-10-CM | POA: Insufficient documentation

## 2021-08-01 DIAGNOSIS — I10 Essential (primary) hypertension: Secondary | ICD-10-CM | POA: Diagnosis not present

## 2021-08-01 DIAGNOSIS — W1839XA Other fall on same level, initial encounter: Secondary | ICD-10-CM | POA: Insufficient documentation

## 2021-08-01 DIAGNOSIS — R2990 Unspecified symptoms and signs involving the nervous system: Secondary | ICD-10-CM

## 2021-08-01 DIAGNOSIS — G4700 Insomnia, unspecified: Secondary | ICD-10-CM | POA: Diagnosis not present

## 2021-08-01 DIAGNOSIS — M25572 Pain in left ankle and joints of left foot: Secondary | ICD-10-CM | POA: Diagnosis not present

## 2021-08-01 DIAGNOSIS — R29898 Other symptoms and signs involving the musculoskeletal system: Secondary | ICD-10-CM | POA: Diagnosis not present

## 2021-08-01 DIAGNOSIS — R208 Other disturbances of skin sensation: Secondary | ICD-10-CM | POA: Insufficient documentation

## 2021-08-01 DIAGNOSIS — F32A Depression, unspecified: Secondary | ICD-10-CM | POA: Insufficient documentation

## 2021-08-01 DIAGNOSIS — R7303 Prediabetes: Secondary | ICD-10-CM | POA: Diagnosis not present

## 2021-08-01 DIAGNOSIS — M79605 Pain in left leg: Secondary | ICD-10-CM | POA: Insufficient documentation

## 2021-08-01 DIAGNOSIS — Z86711 Personal history of pulmonary embolism: Secondary | ICD-10-CM | POA: Insufficient documentation

## 2021-08-01 DIAGNOSIS — K219 Gastro-esophageal reflux disease without esophagitis: Secondary | ICD-10-CM | POA: Diagnosis not present

## 2021-08-01 DIAGNOSIS — N390 Urinary tract infection, site not specified: Secondary | ICD-10-CM

## 2021-08-01 DIAGNOSIS — R202 Paresthesia of skin: Secondary | ICD-10-CM

## 2021-08-01 DIAGNOSIS — S82892A Other fracture of left lower leg, initial encounter for closed fracture: Secondary | ICD-10-CM | POA: Insufficient documentation

## 2021-08-01 DIAGNOSIS — Z9181 History of falling: Secondary | ICD-10-CM | POA: Insufficient documentation

## 2021-08-01 DIAGNOSIS — Z79899 Other long term (current) drug therapy: Secondary | ICD-10-CM | POA: Insufficient documentation

## 2021-08-01 DIAGNOSIS — Z7901 Long term (current) use of anticoagulants: Secondary | ICD-10-CM | POA: Insufficient documentation

## 2021-08-01 DIAGNOSIS — Z6838 Body mass index (BMI) 38.0-38.9, adult: Secondary | ICD-10-CM | POA: Insufficient documentation

## 2021-08-01 DIAGNOSIS — I119 Hypertensive heart disease without heart failure: Secondary | ICD-10-CM | POA: Diagnosis not present

## 2021-08-01 DIAGNOSIS — R29818 Other symptoms and signs involving the nervous system: Secondary | ICD-10-CM | POA: Insufficient documentation

## 2021-08-01 DIAGNOSIS — Z89511 Acquired absence of right leg below knee: Secondary | ICD-10-CM | POA: Diagnosis not present

## 2021-08-01 DIAGNOSIS — R2689 Other abnormalities of gait and mobility: Secondary | ICD-10-CM | POA: Insufficient documentation

## 2021-08-01 DIAGNOSIS — R471 Dysarthria and anarthria: Secondary | ICD-10-CM | POA: Diagnosis not present

## 2021-08-01 DIAGNOSIS — E785 Hyperlipidemia, unspecified: Secondary | ICD-10-CM | POA: Diagnosis present

## 2021-08-01 DIAGNOSIS — S8252XA Displaced fracture of medial malleolus of left tibia, initial encounter for closed fracture: Secondary | ICD-10-CM | POA: Insufficient documentation

## 2021-08-01 DIAGNOSIS — Y92009 Unspecified place in unspecified non-institutional (private) residence as the place of occurrence of the external cause: Secondary | ICD-10-CM | POA: Insufficient documentation

## 2021-08-01 DIAGNOSIS — E7800 Pure hypercholesterolemia, unspecified: Secondary | ICD-10-CM | POA: Diagnosis not present

## 2021-08-01 DIAGNOSIS — Z8249 Family history of ischemic heart disease and other diseases of the circulatory system: Secondary | ICD-10-CM | POA: Insufficient documentation

## 2021-08-01 LAB — DIFFERENTIAL
Abs Immature Granulocytes: 0.03 10*3/uL (ref 0.00–0.07)
Basophils Absolute: 0 10*3/uL (ref 0.0–0.1)
Basophils Relative: 0 %
Eosinophils Absolute: 0.3 10*3/uL (ref 0.0–0.5)
Eosinophils Relative: 3 %
Immature Granulocytes: 0 %
Lymphocytes Relative: 35 %
Lymphs Abs: 2.6 10*3/uL (ref 0.7–4.0)
Monocytes Absolute: 0.6 10*3/uL (ref 0.1–1.0)
Monocytes Relative: 8 %
Neutro Abs: 4.1 10*3/uL (ref 1.7–7.7)
Neutrophils Relative %: 54 %

## 2021-08-01 LAB — CBC
HCT: 41.4 % (ref 36.0–46.0)
Hemoglobin: 13.3 g/dL (ref 12.0–15.0)
MCH: 29.3 pg (ref 26.0–34.0)
MCHC: 32.1 g/dL (ref 30.0–36.0)
MCV: 91.2 fL (ref 80.0–100.0)
Platelets: 325 10*3/uL (ref 150–400)
RBC: 4.54 MIL/uL (ref 3.87–5.11)
RDW: 15.9 % — ABNORMAL HIGH (ref 11.5–15.5)
WBC: 7.6 10*3/uL (ref 4.0–10.5)
nRBC: 0 % (ref 0.0–0.2)

## 2021-08-01 LAB — I-STAT CHEM 8, ED
BUN: 22 mg/dL — ABNORMAL HIGH (ref 6–20)
Calcium, Ion: 1.16 mmol/L (ref 1.15–1.40)
Chloride: 99 mmol/L (ref 98–111)
Creatinine, Ser: 0.8 mg/dL (ref 0.44–1.00)
Glucose, Bld: 97 mg/dL (ref 70–99)
HCT: 41 % (ref 36.0–46.0)
Hemoglobin: 13.9 g/dL (ref 12.0–15.0)
Potassium: 3.7 mmol/L (ref 3.5–5.1)
Sodium: 138 mmol/L (ref 135–145)
TCO2: 32 mmol/L (ref 22–32)

## 2021-08-01 LAB — COMPREHENSIVE METABOLIC PANEL
ALT: 42 U/L (ref 0–44)
AST: 25 U/L (ref 15–41)
Albumin: 3.5 g/dL (ref 3.5–5.0)
Alkaline Phosphatase: 105 U/L (ref 38–126)
Anion gap: 9 (ref 5–15)
BUN: 20 mg/dL (ref 6–20)
CO2: 32 mmol/L (ref 22–32)
Calcium: 9.6 mg/dL (ref 8.9–10.3)
Chloride: 98 mmol/L (ref 98–111)
Creatinine, Ser: 0.84 mg/dL (ref 0.44–1.00)
GFR, Estimated: >60 mL/min (ref >60)
Glucose, Bld: 102 mg/dL — ABNORMAL HIGH (ref 70–99)
Potassium: 3.8 mmol/L (ref 3.5–5.1)
Sodium: 139 mmol/L (ref 135–145)
Total Bilirubin: 0.5 mg/dL (ref 0.3–1.2)
Total Protein: 7.4 g/dL (ref 6.5–8.1)

## 2021-08-01 LAB — I-STAT BETA HCG BLOOD, ED (MC, WL, AP ONLY): I-stat hCG, quantitative: 8.6 m[IU]/mL — ABNORMAL HIGH (ref <5)

## 2021-08-01 LAB — PROTIME-INR
INR: 1 (ref 0.8–1.2)
Prothrombin Time: 13 seconds (ref 11.4–15.2)

## 2021-08-01 LAB — CBG MONITORING, ED: Glucose-Capillary: 110 mg/dL — ABNORMAL HIGH (ref 70–99)

## 2021-08-01 LAB — GLUCOSE, CAPILLARY: Glucose-Capillary: 92 mg/dL (ref 70–99)

## 2021-08-01 LAB — APTT: aPTT: 32 seconds (ref 24–36)

## 2021-08-01 LAB — ETHANOL: Alcohol, Ethyl (B): <10 mg/dL (ref <10)

## 2021-08-01 MED ORDER — LORAZEPAM 2 MG/ML IJ SOLN: 1.0000 mg | Freq: Once | INTRAMUSCULAR | Status: DC | PRN

## 2021-08-01 MED ORDER — ZOLPIDEM TARTRATE 5 MG PO TABS
5.0000 mg | ORAL_TABLET | Freq: Every evening | ORAL | Status: DC | PRN
Start: 2021-08-01 — End: 2021-08-02
  Administered 2021-08-02: 5 mg via ORAL
  Filled 2021-08-01: qty 1

## 2021-08-01 MED ORDER — SODIUM CHLORIDE 0.9% FLUSH
3.0000 mL | Freq: Once | INTRAVENOUS | Status: AC
  Administered 2021-08-01: 3 mL via INTRAVENOUS

## 2021-08-01 MED ORDER — GABAPENTIN 300 MG PO CAPS
300.0000 mg | ORAL_CAPSULE | Freq: Three times a day (TID) | ORAL | Status: DC
  Administered 2021-08-01 – 2021-08-02 (×3): 300 mg via ORAL
  Filled 2021-08-01 (×3): qty 1

## 2021-08-01 MED ORDER — AMITRIPTYLINE HCL 100 MG PO TABS: 100.0000 mg | ORAL_TABLET | Freq: Every day | ORAL | Status: DC

## 2021-08-01 MED ORDER — IOHEXOL 350 MG/ML SOLN
75.0000 mL | Freq: Once | INTRAVENOUS | Status: AC | PRN
  Administered 2021-08-01: 75 mL via INTRAVENOUS

## 2021-08-01 MED ORDER — PANTOPRAZOLE SODIUM 40 MG PO TBEC
80.0000 mg | DELAYED_RELEASE_TABLET | Freq: Every day | ORAL | Status: DC
  Administered 2021-08-02: 80 mg via ORAL
  Filled 2021-08-01: qty 2

## 2021-08-01 MED ORDER — CEPHALEXIN 500 MG PO CAPS
500.0000 mg | ORAL_CAPSULE | Freq: Two times a day (BID) | ORAL | Status: DC
Start: 2021-08-01 — End: 2021-08-02
  Administered 2021-08-01 – 2021-08-02 (×2): 500 mg via ORAL
  Filled 2021-08-01 (×2): qty 1

## 2021-08-01 MED ORDER — AMITRIPTYLINE HCL 50 MG PO TABS
50.0000 mg | ORAL_TABLET | Freq: Every day | ORAL | Status: DC
  Administered 2021-08-01: 50 mg via ORAL
  Filled 2021-08-01 (×2): qty 1

## 2021-08-01 NOTE — Assessment & Plan Note (Addendum)
LLE weakness much improved. CT head/CTA head and neck negative for acute findings. Presentation suspicious for TIA. EEG completed yesterday with no abnormalities. Echo this morning showed no evidence of cardiac source of emoblism. Risk stratification labs completed yesterday showing LDL 233, and Hgb A1c 6.3. Pt was started on Crestor 40 mg daily and full lipid panel ordered.  Differential also includes CVA (for which she is at increased risk due to hemochromatosis) vs functional neuro disorder.  Pt has significant L ankle pain s/p falling yesterday d/t  weakness. Will r/o fracture w/ x-ray. -neuro following, appreciate recommendations -unable to obtain MRI due to metal hardware in jaw -repeat CT head today -Crestor 40 mg daily -f/u lipid panel -f/u L ankle xray -echo -PT/OT/SLP -neuro checks -permissive hypertension for now, await further neuro recs -hold eliquis for now per neuro. Restart today if repeat CT normal
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
## 2021-08-01 NOTE — ED Triage Notes (Signed)
Pt coming in via [facility] as a code stroke. Pt states she went to bed at 9 PM last night and woke up with left leg weakness. Pt ambulatory in home and then left leg gave out and she fell down and was unable to stand up. Pt husband reported that she seemed to be having a difficult time completing sentences and some slurred speech. 20 g left AC PTA  BP 150/90 HR 90 96% room air  CBG 108

## 2021-08-02 ENCOUNTER — Observation Stay (HOSPITAL_COMMUNITY): Payer: Medicare HMO

## 2021-08-02 ENCOUNTER — Other Ambulatory Visit (HOSPITAL_COMMUNITY): Payer: Medicare HMO

## 2021-08-02 ENCOUNTER — Observation Stay (HOSPITAL_BASED_OUTPATIENT_CLINIC_OR_DEPARTMENT_OTHER): Payer: Medicare HMO

## 2021-08-02 DIAGNOSIS — E782 Mixed hyperlipidemia: Secondary | ICD-10-CM | POA: Diagnosis not present

## 2021-08-02 DIAGNOSIS — I10 Essential (primary) hypertension: Secondary | ICD-10-CM

## 2021-08-02 DIAGNOSIS — G459 Transient cerebral ischemic attack, unspecified: Secondary | ICD-10-CM | POA: Diagnosis not present

## 2021-08-02 DIAGNOSIS — R29898 Other symptoms and signs involving the musculoskeletal system: Secondary | ICD-10-CM | POA: Diagnosis not present

## 2021-08-02 LAB — LIPID PANEL
Cholesterol: 344 mg/dL — ABNORMAL HIGH (ref 0–200)
HDL: 77 mg/dL (ref >40)
LDL Cholesterol: 234 mg/dL — ABNORMAL HIGH (ref 0–99)
Total CHOL/HDL Ratio: 4.5 RATIO
Triglycerides: 167 mg/dL — ABNORMAL HIGH (ref <150)
VLDL: 33 mg/dL (ref 0–40)

## 2021-08-02 LAB — COMPREHENSIVE METABOLIC PANEL
ALT: 34 U/L (ref 0–44)
AST: 25 U/L (ref 15–41)
Albumin: 2.9 g/dL — ABNORMAL LOW (ref 3.5–5.0)
Alkaline Phosphatase: 85 U/L (ref 38–126)
Anion gap: 9 (ref 5–15)
BUN: 24 mg/dL — ABNORMAL HIGH (ref 6–20)
CO2: 28 mmol/L (ref 22–32)
Calcium: 8.9 mg/dL (ref 8.9–10.3)
Chloride: 102 mmol/L (ref 98–111)
Creatinine, Ser: 0.84 mg/dL (ref 0.44–1.00)
GFR, Estimated: >60 mL/min (ref >60)
Glucose, Bld: 133 mg/dL — ABNORMAL HIGH (ref 70–99)
Potassium: 4.1 mmol/L (ref 3.5–5.1)
Sodium: 139 mmol/L (ref 135–145)
Total Bilirubin: 0.3 mg/dL (ref 0.3–1.2)
Total Protein: 6.1 g/dL — ABNORMAL LOW (ref 6.5–8.1)

## 2021-08-02 LAB — URINALYSIS, COMPLETE (UACMP) WITH MICROSCOPIC
Bacteria, UA: NONE SEEN
Bilirubin Urine: NEGATIVE
Glucose, UA: NEGATIVE mg/dL
Hgb urine dipstick: NEGATIVE
Ketones, ur: NEGATIVE mg/dL
Nitrite: NEGATIVE
Protein, ur: NEGATIVE mg/dL
Specific Gravity, Urine: 1.026 (ref 1.005–1.030)
pH: 5 (ref 5.0–8.0)

## 2021-08-02 LAB — RAPID URINE DRUG SCREEN, HOSP PERFORMED
Amphetamines: NOT DETECTED
Barbiturates: NOT DETECTED
Benzodiazepines: NOT DETECTED
Cocaine: NOT DETECTED
Opiates: NOT DETECTED
Tetrahydrocannabinol: NOT DETECTED

## 2021-08-02 LAB — HEMOGLOBIN A1C
Hgb A1c MFr Bld: 6.3 % — ABNORMAL HIGH (ref 4.8–5.6)
Mean Plasma Glucose: 134.11 mg/dL

## 2021-08-02 LAB — CBC
HCT: 34.6 % — ABNORMAL LOW (ref 36.0–46.0)
Hemoglobin: 10.9 g/dL — ABNORMAL LOW (ref 12.0–15.0)
MCH: 29.2 pg (ref 26.0–34.0)
MCHC: 31.5 g/dL (ref 30.0–36.0)
MCV: 92.8 fL (ref 80.0–100.0)
Platelets: 288 10*3/uL (ref 150–400)
RBC: 3.73 MIL/uL — ABNORMAL LOW (ref 3.87–5.11)
RDW: 16.2 % — ABNORMAL HIGH (ref 11.5–15.5)
WBC: 7.8 10*3/uL (ref 4.0–10.5)
nRBC: 0 % (ref 0.0–0.2)

## 2021-08-02 LAB — ECHOCARDIOGRAM LIMITED BUBBLE STUDY
Area-P 1/2: 6.32 cm2
S' Lateral: 2.5 cm

## 2021-08-02 LAB — LDL CHOLESTEROL, DIRECT: Direct LDL: 233.1 mg/dL — ABNORMAL HIGH (ref 0–99)

## 2021-08-02 MED ORDER — METOPROLOL SUCCINATE ER 100 MG PO TB24: 150.0000 mg | ORAL_TABLET | Freq: Every day | ORAL | Status: DC

## 2021-08-02 MED ORDER — ACETAMINOPHEN 325 MG PO TABS
650.0000 mg | ORAL_TABLET | Freq: Four times a day (QID) | ORAL | Status: DC | PRN
Start: 2021-08-02 — End: 2021-08-02

## 2021-08-02 MED ORDER — ROSUVASTATIN CALCIUM 20 MG PO TABS
40.0000 mg | ORAL_TABLET | Freq: Every day | ORAL | Status: DC
  Administered 2021-08-02: 40 mg via ORAL
  Filled 2021-08-02: qty 2

## 2021-08-02 MED ORDER — ROSUVASTATIN CALCIUM 40 MG PO TABS: 40.0000 mg | ORAL_TABLET | Freq: Every day | ORAL | 0 refills | Status: DC

## 2021-08-02 MED ORDER — AMITRIPTYLINE HCL 100 MG PO TABS: 50.0000 mg | ORAL_TABLET | Freq: Every day | ORAL | Status: DC

## 2021-08-02 MED ORDER — ACETAMINOPHEN 325 MG PO TABS: 650.0000 mg | ORAL_TABLET | Freq: Four times a day (QID) | ORAL | Status: DC | PRN

## 2021-08-02 MED ORDER — CEPHALEXIN 500 MG PO CAPS: 500.0000 mg | ORAL_CAPSULE | Freq: Two times a day (BID) | ORAL | 0 refills | Status: DC

## 2021-08-02 MED ORDER — APIXABAN 5 MG PO TABS
5.0000 mg | ORAL_TABLET | Freq: Two times a day (BID) | ORAL | Status: DC
  Administered 2021-08-02: 5 mg via ORAL
  Filled 2021-08-02: qty 1
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
## 2021-08-02 NOTE — Progress Notes (Signed)
  Echocardiogram 2D Echocardiogram has been performed.  [name] 08/02/2021, 10:07 AM

## 2021-08-28 ENCOUNTER — Other Ambulatory Visit: Payer: Self-pay | Admitting: Student

## 2021-09-02 ENCOUNTER — Encounter: Payer: Self-pay | Admitting: Neurology

## 2021-09-02 ENCOUNTER — Ambulatory Visit (INDEPENDENT_AMBULATORY_CARE_PROVIDER_SITE_OTHER): Payer: Self-pay | Admitting: Neurology

## 2021-09-02 DIAGNOSIS — Z91199 Patient's noncompliance with other medical treatment and regimen due to unspecified reason: Secondary | ICD-10-CM

## 2021-09-02 NOTE — Progress Notes (Signed)
Patient no showed

## 2021-09-07 ENCOUNTER — Ambulatory Visit: Payer: Medicare HMO

## 2021-10-09 ENCOUNTER — Other Ambulatory Visit: Payer: Self-pay | Admitting: Student

## 2021-11-04 DIAGNOSIS — E66812 Obesity, class 2: Secondary | ICD-10-CM | POA: Insufficient documentation

## 2021-12-23 ENCOUNTER — Institutional Professional Consult (permissible substitution): Payer: Self-pay | Admitting: Neurology

## 2021-12-28 ENCOUNTER — Institutional Professional Consult (permissible substitution): Payer: Medicare HMO | Admitting: Neurology

## 2022-01-27 ENCOUNTER — Telehealth: Payer: Self-pay | Admitting: Neurology

## 2022-01-27 ENCOUNTER — Encounter: Payer: Self-pay | Admitting: Neurology

## 2022-01-27 ENCOUNTER — Ambulatory Visit: Payer: Medicare HMO | Admitting: Neurology

## 2022-01-27 VITALS — BP 161/90 | HR 107 | Ht 60.0 in | Wt 203.6 lb

## 2022-01-27 DIAGNOSIS — R531 Weakness: Secondary | ICD-10-CM | POA: Diagnosis not present

## 2022-01-27 DIAGNOSIS — G459 Transient cerebral ischemic attack, unspecified: Secondary | ICD-10-CM
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
## 2022-01-27 NOTE — Patient Instructions (Signed)
Physical therapy for left-sided weakness  Transient Ischemic Attack A transient ischemic attack (TIA) happens when blood supply to the brain is blocked temporarily. A TIA causes stroke-like symptoms that go away quickly without causing any permanent damage. Having a TIA can be considered a warning sign for a stroke and should not be ignored. A person who has a TIA is at higher risk for a stroke. What are the causes? This condition is caused by a temporary blockage in an artery in the head or neck. This means the brain does not get the blood supply it needs. A blockage can be caused by: Fatty buildup in an artery in the head or neck (atherosclerosis). A blood clot traveling from the heart. An artery tear (dissection). Inflammation of an artery (vasculitis). Sometimes the cause is not known. What increases the risk? Certain factors make you more likely to develop this condition. Some of these are things you can change, including: Using products that contain nicotine or tobacco. Being inactive. Heavy alcohol use. Drug use, especially cocaine and methamphetamine. Medical conditions that may increase your risk include: High blood pressure (hypertension). High cholesterol. Diabetes. Heart disease (coronary artery disease). An irregular heartbeat, also called atrial fibrillation (AFib). Sickle cell disease. Blood clotting disorders (hypercoagulable state). Other risk factors include: Being over the age of 92. Being female. Obesity. Sleep problems such as sleep apnea. Family history of stroke. Previous history of blood clots, stroke, TIA, or heart attack. What are the signs or symptoms? Symptoms of a TIA are the same as those of a stroke. The symptoms develop suddenly, and then go away quickly. They may include: Dizziness, loss of balance and coordination, or trouble walking. Vision changes, such as double vision, blurred vision, or loss of vision. Weakness or numbness in your face, arm, or  leg, especially on one side of your body. Trouble speaking, understanding speech, or both (aphasia). Nausea and vomiting. Severe headache. Confusion. If possible, note what time your symptoms started. Tell your health care provider. How is this diagnosed? This condition may be diagnosed based on: Your symptoms and medical history. A physical exam. Imaging tests, usually a CT scan or MRI of the brain. Blood tests. You may also have other tests, including: Electrocardiogram (ECG). Echocardiogram. Continuous heart monitoring. Carotid ultrasound. A scan of blood circulation in the brain (CT angiogram or MR angiogram). How is this treated? The goal of treatment is to reduce the risk for a stroke. Stroke prevention therapies may include: Changes to diet and lifestyle, such as being physically active and stopping smoking. Treating other health conditions, such as diabetes or AFib. Medicines to thin the blood (antiplatelets or anticoagulants). Blood pressure medicines. Medicines to reduce cholesterol. If testing shows a narrowing in the arteries to your brain, your health care provider may recommend a procedure, such as: Carotid endarterectomy. This is done to remove the blockage from your artery. Carotid angioplasty and stenting. This uses a small mesh tube (stent) to open or widen an artery in the neck. The stent helps keep the artery open by supporting the artery walls. Follow these instructions at home: Medicines Take over-the-counter and prescription medicines only as told by your health care provider. If you were told to take a medicine to thin your blood, such as aspirin or an anticoagulant, use it exactly as told by your health care provider. Taking too much blood-thinning medicine can cause bleeding. Taking too little will not protect you against a stroke and other problems. Eating and drinking  Eat 5  or more servings of fruits and vegetables each day. Follow guidelines from your  health care provider about your diet. You may need to follow a certain diet to help manage risk factors for stroke. This may include: Eating a low-fat, low-salt diet. Choosing high-fiber foods. Limiting carbohydrates and sugar. If you drink alcohol: Limit how much you have to: 0-1 drink a day for women who are not pregnant. 0-2 drinks a day for men. Know how much alcohol is in your drink. In the [place] one drink equals one 12 oz bottle of beer (355 mL), one 5 oz glass of wine (148 mL), or one 1 oz glass of hard liquor (44 mL). General instructions Maintain a healthy weight. Try to get at least 30 minutes of exercise on most days. Get treatment if you have sleep apnea. Do not use any products that contain nicotine or tobacco. These products include cigarettes, chewing tobacco, and vaping devices, such as e-cigarettes. If you need help quitting, ask your health care provider. Do not use illegal drugs. Keep all follow-up visits. Your health care provider will want to know if you have any more symptoms and to check blood labs if any medicines were prescribed. Where to find more information American Stroke Association: stroke.org Get help right away if: You have chest pain. You have fast or irregular heartbeats (palpitations). You have any symptoms of a stroke. "BE FAST" is an easy way to remember the main warning signs of a stroke. B - Balance. Signs are dizziness, sudden trouble walking, or loss of balance. E - Eyes. Signs are trouble seeing or a sudden change in vision. F - Face. Signs are sudden weakness or numbness of the face, or the face or eyelid drooping on one side. A - Arms. Signs are weakness or numbness in an arm. This happens suddenly and usually on one side of the body. S - Speech.Signs are sudden trouble speaking, slurred speech, or trouble understanding what people say. T - Time. Time to call emergency services. Write down what time symptoms started. You have other signs of a  stroke, such as: A sudden, severe headache with no known cause. Nausea or vomiting. Seizure. These symptoms may be an emergency. Get help right away. Call 911. Do not wait to see if the symptoms will go away. Do not drive yourself to the hospital. This information is not intended to replace advice given to you by your health care provider. Make sure you discuss any questions you have with your health care provider. Document Revised: 07/10/2021 Document Reviewed: 07/10/2021 Elsevier Patient Education  [employer].

## 2022-01-27 NOTE — Telephone Encounter (Signed)
Referral for Physical Therapy fax to [facility]. DTOIZ:[phone] Fax: [phone]

## 2022-02-01 DIAGNOSIS — E119 Type 2 diabetes mellitus without complications: Secondary | ICD-10-CM | POA: Insufficient documentation

## 2022-02-15 ENCOUNTER — Ambulatory Visit: Payer: Medicare HMO

## 2022-03-25 DIAGNOSIS — I872 Venous insufficiency (chronic) (peripheral): Secondary | ICD-10-CM | POA: Insufficient documentation

## 2022-04-01 ENCOUNTER — Encounter (HOSPITAL_BASED_OUTPATIENT_CLINIC_OR_DEPARTMENT_OTHER): Payer: Medicare HMO | Attending: General Surgery | Admitting: General Surgery

## 2022-04-01 DIAGNOSIS — I10 Essential (primary) hypertension: Secondary | ICD-10-CM | POA: Insufficient documentation

## 2022-04-01 DIAGNOSIS — Z89511 Acquired absence of right leg below knee: Secondary | ICD-10-CM | POA: Diagnosis not present

## 2022-04-01 DIAGNOSIS — E11622 Type 2 diabetes mellitus with other skin ulcer: Secondary | ICD-10-CM | POA: Diagnosis present

## 2022-04-01 DIAGNOSIS — L97821 Non-pressure chronic ulcer of other part of left lower leg limited to breakdown of skin: Secondary | ICD-10-CM | POA: Insufficient documentation

## 2022-04-01 DIAGNOSIS — I872 Venous insufficiency (chronic) (peripheral): Secondary | ICD-10-CM | POA: Insufficient documentation

## 2022-04-01 DIAGNOSIS — K760 Fatty (change of) liver, not elsewhere classified: Secondary | ICD-10-CM | POA: Diagnosis not present

## 2022-04-01 DIAGNOSIS — E7800 Pure hypercholesterolemia, unspecified: Secondary | ICD-10-CM | POA: Insufficient documentation

## 2022-04-02 NOTE — Progress Notes (Signed)
SIMRANJIT, CHERRINGTON ([id number]) [phone] [id number] Page 1 of 4 Visit Report for 04/01/2022 Abuse Risk Screen Details Patient Name: Date of Service: Tina Patch NICE K. 04/01/2022 8:00 A M Medical Record Number: [id number] Patient Account Number: [id number] Date of Birth/Sex: Treating RN: 02/08/1961 (62 y.o. [name] Primary Care Jenene Kauffmann: Terrill Mohr Other Clinician: Referring Iyauna Sing: Treating Tishara Pizano/Extender: Serita Kyle Weeks in Treatment: 0 Abuse Risk Screen Items Answer ABUSE RISK SCREEN: Has anyone close to you tried to hurt or harm you recentlyo No Do you feel uncomfortable with anyone in your familyo No Has anyone forced you do things that you didnt want to doo No Electronic Signature(s) Signed: 04/01/2022 5:34:15 PM By: Dellie Catholic RN Entered By: Dellie Catholic on 04/01/2022 08:35:18 -------------------------------------------------------------------------------- Activities of Daily Living Details Patient Name: Date of Service: Tina Patch NICE K. 04/01/2022 8:00 A M Medical Record Number: [id number] Patient Account Number: [id number] Date of Birth/Sex: Treating RN: 1960/09/29 (62 y.o. [name] Primary Care Ginni Eichler: Terrill Mohr Other Clinician: Referring Charene Mccallister: Treating Aneya Daddona/Extender: Serita Kyle Weeks in Treatment: 0 Activities of Daily Living Items Answer Activities of Daily Living (Please select one for each item) Drive Automobile Completely Able T Medications ake Completely Able Use T elephone Completely Able Care for Appearance Completely Able Use T oilet Completely Able Bath / Shower Completely Able Dress Self Completely Able Feed Self Completely Able Walk Completely Able Get In / Out Bed Completely Able Housework Completely Able Prepare Meals Completely Able Handle Money Completely Able Shop for Self Completely Able Electronic Signature(s) Signed:  04/01/2022 5:34:15 PM By: Dellie Catholic RN Entered By: Dellie Catholic on 04/01/2022 08:36:47 [name] ([id number]) [phone] [id number] Page 2 of 4 -------------------------------------------------------------------------------- Education Screening Details Patient Name: Date of Service: Tina Patch NICE K. 04/01/2022 8:00 A M Medical Record Number: [id number] Patient Account Number: [id number] Date of Birth/Sex: Treating RN: [birth year] (62 y.o. [name] Primary Care Shephanie Romas: Terrill Mohr Other Clinician: Referring Aissa Lisowski: Treating Haven Foss/Extender: Renaldo Harrison in Treatment: 0 Learning Preferences/Education Level/Primary Language Learning Preference: Explanation, Demonstration, Printed Material Highest Education Level: High School Preferred Language: English Cognitive Barrier Language Barrier: No Translator Needed: No Memory Deficit: No Emotional Barrier: No Cultural/Religious Beliefs Affecting Medical Care: No Physical Barrier Impaired Vision: No Impaired Hearing: No Decreased Hand dexterity: No Knowledge/Comprehension Knowledge Level: High Comprehension Level: High Ability to understand written instructions: High Ability to understand verbal instructions: High Motivation Anxiety Level: Calm Cooperation: Cooperative Education Importance: Acknowledges Need Interest in Health Problems: Asks Questions Perception: Coherent Willingness to Engage in Self-Management High Activities: Readiness to Engage in Self-Management High Activities: Electronic Signature(s) Signed: 04/01/2022 5:34:15 PM By: Dellie Catholic RN Entered By: Dellie Catholic on 04/01/2022 08:37:18 -------------------------------------------------------------------------------- Fall Risk Assessment Details Patient Name: Date of Service: Tina Patch NICE K. 04/01/2022 8:00 A M Medical Record Number: [id number] Patient Account Number:  [id number] Date of Birth/Sex: Treating RN: 1960/02/14 (62 y.o. [name] Primary Care Nitasha Jewel: Terrill Mohr Other Clinician: Referring Gladys Deckard: Treating Meggan Dhaliwal/Extender: Serita Kyle Weeks in Treatment: 0 Fall Risk Assessment Items Have you had 2 or more falls in the last 12 monthso 0 No Have you had any fall that resulted in injury in the last 12 monthso 0 No [place], [place] K ([id number]) [phone] [id number] Page 3 of 4 FALLS RISK SCREEN History of falling - immediate or within 3 months 0 No Secondary diagnosis (Do you have 2 or more medical diagnoseso) 0 No Ambulatory aid  None/bed rest/wheelchair/nurse 0 No Crutches/cane/walker 0 No Furniture 0 No Intravenous therapy Access/Saline/Heparin Lock 0 No Gait/Transferring Normal/ bed rest/ wheelchair 0 No Weak (short steps with or without shuffle, stooped but able to lift head while walking, may seek 0 No support from furniture) Impaired (short steps with shuffle, may have difficulty arising from chair, head down, impaired 0 No balance) Mental Status Oriented to own ability 0 No Electronic Signature(s) Signed: 04/01/2022 5:34:15 PM By: Dellie Catholic RN Entered By: Dellie Catholic on 04/01/2022 08:44:29 -------------------------------------------------------------------------------- Foot Assessment Details Patient Name: Date of Service: Tina Patch NICE K. 04/01/2022 8:00 A M Medical Record Number: [id number] Patient Account Number: [id number] Date of Birth/Sex: Treating RN: 1960-05-20 (62 y.o. [name] Primary Care Tomorrow [name]: Terrill Mohr Other Clinician: Referring [name]: Treating Achol Azpeitia/Extender: Serita Kyle Weeks in Treatment: 0 Foot Assessment Items Site Locations + = Sensation present, - = Sensation absent, C = Callus, U = Ulcer R = Redness, W = Warmth, M = Maceration, PU = Pre-ulcerative lesion F = Fissure, S = Swelling, D =  Dryness Assessment Right: Left: Other Deformity: No No Prior Foot Ulcer: No No Prior Amputation: No No Charcot Joint: No No Ambulatory Status: Ambulatory Without Help GaitILINE, MOUSER ([id number]) [phone] [id number] Page 4 of 4 Electronic Signature(s) Signed: 04/01/2022 5:34:15 PM By: Dellie Catholic RN Entered By: Dellie Catholic on 04/01/2022 08:45:01 -------------------------------------------------------------------------------- Nutrition Risk Screening Details Patient Name: Date of Service: Tina Patch NICE K. 04/01/2022 8:00 A M Medical Record Number: [id number] Patient Account Number: [id number] Date of Birth/Sex: Treating RN: 27-Apr-1960 (62 y.o. [name] Primary Care Vicktoria Muckey: Terrill Mohr Other Clinician: Referring Tasmine Hipwell: Treating Delva Derden/Extender: Serita Kyle Weeks in Treatment: 0 Height (in): 60 Weight (lbs): 198 Body Mass Index (BMI): 38.7 Nutrition Risk Screening Items Score Screening NUTRITION RISK SCREEN: I have an illness or condition that made me change the kind and/or amount of food I eat 0 No I eat fewer than two meals per day 0 No I eat few fruits and vegetables, or milk products 0 No I have three or more drinks of beer, liquor or wine almost every day 0 No I have tooth or mouth problems that make it hard for me to eat 0 No I don't always have enough money to buy the food I need 0 No I eat alone most of the time 0 No I take three or more different prescribed or over-the-counter drugs a day 0 No Without wanting to, I have lost or gained 10 pounds in the last six months 0 No I am not always physically able to shop, cook and/or feed myself 0 No Nutrition Protocols Good Risk Protocol 0 No interventions needed Moderate Risk Protocol High Risk Proctocol Risk Level: Good Risk Score: 0 Electronic Signature(s) Signed: 04/01/2022 5:34:15 PM By: Dellie Catholic RN Entered By: Dellie Catholic on 04/01/2022 08:44:37

## 2022-04-02 NOTE — Progress Notes (Signed)
EEVIE, GRELLA ([id number]) [id number] Page 1 of 8 Visit Report for 04/01/2022 Allergy List Details Patient Name: Date of Service: [name]. 04/01/2022 8:00 A M Medical Record Number: [id number] Patient Account Number: [id number] Date of Birth/Sex: Treating RN: 1960/03/09 (62 y.o. [name] Primary Care Lismary Kiehn: Terrill Mohr Other Clinician: Referring Lazara Grieser: Treating Yajayra Feldt/Extender: Serita Kyle Weeks in Treatment: 0 Allergies Active Allergies duloxetine Severity: Severe tramadol Allergy Notes Electronic Signature(s) Signed: 04/01/2022 5:34:15 PM By: Tina Catholic RN Entered By: [name] on 04/01/2022 08:28:53 -------------------------------------------------------------------------------- Arrival Information Details Patient Name: Date of Service: [name]. 04/01/2022 8:00 A M Medical Record Number: [id number] Patient Account Number: [id number] Date of Birth/Sex: Treating RN: 1960/12/16 (61 y.o. [name] Primary Care Arlee Bossard: Terrill Mohr Other Clinician: Referring Exie Chrismer: Treating Bryston Colocho/Extender: Renaldo Harrison in Treatment: 0 Visit Information Patient Arrived: Ambulatory Arrival Time: 08:00 Accompanied By: self Transfer Assistance: None Patient Identification Verified: Yes Electronic Signature(s) Signed: 04/01/2022 5:34:15 PM By: Tina Catholic RN Entered By: [name] on 04/01/2022 08:27:40 -------------------------------------------------------------------------------- Clinic Level of Care Assessment Details Patient Name: Date of Service: [name]. 04/01/2022 8:00 A M Medical Record Number: [id number] Patient Account Number: [id number] [name] ([id number]) [id number] Page 2 of 8 Date of Birth/Sex: Treating RN: 04-16-1960 (62 y.o. [name] Primary Care Jalil Lorusso: Other  Clinician: Terrill Mohr Referring Heavin Sebree: Treating Mily Malecki/Extender: Renaldo Harrison in Treatment: 0 Clinic Level of Care Assessment Items TOOL 1 Quantity Score X- 1 0 Use when EandM and Procedure is performed on INITIAL visit ASSESSMENTS - Nursing Assessment / Reassessment X- 1 20 General Physical Exam (combine w/ comprehensive assessment (listed just below) when performed on new pt. evals) X- 1 25 Comprehensive Assessment (HX, ROS, Risk Assessments, Wounds Hx, etc.) ASSESSMENTS - Wound and Skin Assessment / Reassessment X- 1 10 Dermatologic / Skin Assessment (not related to wound area) ASSESSMENTS - Ostomy and/or Continence Assessment and Care '[]'$  - 0 Incontinence Assessment and Management '[]'$  - 0 Ostomy Care Assessment and Management (repouching, etc.) PROCESS - Coordination of Care X - Simple Patient / Family Education for ongoing care 1 15 '[]'$  - 0 Complex (extensive) Patient / Family Education for ongoing care X- 1 10 Staff obtains Programmer, systems, Records, T Results / Process Orders est X- 1 10 Staff telephones HHA, Nursing Homes / Clarify orders / etc '[]'$  - 0 Routine Transfer to another Facility (non-emergent condition) '[]'$  - 0 Routine Hospital Admission (non-emergent condition) X- 1 15 New Admissions / Biomedical engineer / Ordering NPWT Apligraf, etc. , '[]'$  - 0 Emergency Hospital Admission (emergent condition) PROCESS - Special Needs '[]'$  - 0 Pediatric / Minor Patient Management '[]'$  - 0 Isolation Patient Management '[]'$  - 0 Hearing / Language / Visual special needs '[]'$  - 0 Assessment of Community assistance (transportation, D/C planning, etc.) '[]'$  - 0 Additional assistance / Altered mentation '[]'$  - 0 Support Surface(s) Assessment (bed, cushion, seat, etc.) INTERVENTIONS - Miscellaneous '[]'$  - 0 External ear exam '[]'$  - 0 Patient Transfer (multiple staff / Civil Service fast streamer / Similar devices) '[]'$  - 0 Simple Staple / Suture removal (25 or less) '[]'$   - 0 Complex Staple / Suture removal (26 or more) '[]'$  - 0 Hypo/Hyperglycemic Management (do not check if billed separately) X- 1 15 Ankle / Brachial Index (ABI) - do not check if billed separately Has the patient been seen at the hospital within the last three years: Yes Total Score: 120 Level  Of Care: New/Established - Level 4 Electronic Signature(s) Signed: 04/01/2022 5:34:15 PM By: Tina Catholic RN Entered By: [name] on 04/01/2022 17:31:52 [name] ([id number] Page 3 of 8 -------------------------------------------------------------------------------- Compression Therapy Details Patient Name: Date of Service: [name]. 04/01/2022 8:00 A M Medical Record Number: [id number] Patient Account Number: [id number] Date of Birth/Sex: Treating RN: [birth year] (62 y.o. [name] Primary Care Khristian Seals: Terrill Mohr Other Clinician: Referring Samay Delcarlo: Treating Korrie Hofbauer/Extender: Serita Kyle Weeks in Treatment: 0 Compression Therapy Performed for Wound Assessment: Wound #1 Left,Medial Lower Leg Performed By: Clinician Tina Catholic, RN Compression Type: Three Layer Post Procedure Diagnosis Same as Pre-procedure Electronic Signature(s) Signed: 04/01/2022 5:34:15 PM By: Tina Catholic RN Entered By: [name] on 04/01/2022 17:30:13 -------------------------------------------------------------------------------- Encounter Discharge Information Details Patient Name: Date of Service: [name], [name]. 04/01/2022 8:00 A M Medical Record Number: [id number] Patient Account Number: [id number] Date of Birth/Sex: Treating RN: 02-Jul-1960 (62 y.o. [name] Primary Care Asa Baudoin: Terrill Mohr Other Clinician: Referring Olan Kurek: Treating Layken Beg/Extender: Serita Kyle Weeks in Treatment: 0 Encounter Discharge Information Items Post Procedure Vitals Discharge  Condition: Stable Temperature (F): 98.1 Ambulatory Status: Ambulatory Pulse (bpm): 83 Discharge Destination: Home Respiratory Rate (breaths/min): 18 Transportation: Private Auto Blood Pressure (mmHg): 133/83 Accompanied By: self Schedule Follow-up Appointment: Yes Clinical Summary of Care: Patient Declined Electronic Signature(s) Signed: 04/01/2022 5:34:15 PM By: Tina Catholic RN Entered By: [name] on 04/01/2022 17:33:08 -------------------------------------------------------------------------------- Lower Extremity Assessment Details Patient Name: Date of Service: [name]. 04/01/2022 8:00 A M Medical Record Number: [id number] Patient Account Number: [id number] Date of Birth/Sex: Treating RN: [birth year] (62 y.o. [name] Primary Care Terresa Marlett: Terrill Mohr Other Clinician: Referring Seona Clemenson: Treating Jaquel Glassburn/Extender: Serita Kyle Weeks in Treatment: 0 Edema Assessment Assessed: Shirlyn Goltz: Yes] Patrice Paradise: No] [Left: Edema] [Right: Wilfrid Lund ([id number] Page 4 of 8 Left: Right: Point of Measurement: 35 cm From Medial Instep 44.5 cm Ankle Left: Right: Point of Measurement: 10 cm From Medial Instep 27 cm Knee To Floor Left: Right: From Medial Instep 41 cm Vascular Assessment Pulses: Dorsalis Pedis Palpable: [Left:Yes] Blood Pressure: Brachial: [Left:133] Ankle: [Left:Dorsalis Pedis: 160 1.20] Electronic Signature(s) Signed: 04/01/2022 5:34:15 PM By: Tina Catholic RN Entered By: [name] on 04/01/2022 09:11:48 -------------------------------------------------------------------------------- Multi Wound Chart Details Patient Name: Date of Service: [name]. 04/01/2022 8:00 A M Medical Record Number: [id number] Patient Account Number: [id number] Date of Birth/Sex: Treating RN: [birth year] (62 y.o. F) Primary Care Jahzeel Poythress: Terrill Mohr Other  Clinician: Referring Ajanee Buren: Treating Leul Narramore/Extender: Serita Kyle Weeks in Treatment: 0 Vital Signs Height(in): 60 Pulse(bpm): 9 Weight(lbs): 198 Blood Pressure(mmHg): 133/83 Body Mass Index(BMI): 38.7 Temperature(F): 98.1 Respiratory Rate(breaths/min): 18 [1:Photos:] [N/A:N/A] Left, Medial Lower Leg N/A N/A Wound Location: Blister N/A N/A Wounding Event: Venous Leg Ulcer N/A N/A Primary Etiology: Hypertension N/A N/A Comorbid History: 03/25/2022 N/A N/A Date Acquired: 0 N/A N/A Weeks of Treatment: Open N/A N/A Wound Status: No N/A N/A Wound Recurrence: 6.5x3x0.1 N/A N/A Measurements L x W x D (cm) 15.315 N/A N/A A (cm) : rea 1.532 N/A N/A Volume (cm) : Full Thickness Without Exposed N/A N/A Classification: Support Structures Medium N/A N/A Exudate AmountROWEN, LAINEZ ([id number] Page 5 of 8 Serosanguineous N/A N/A Exudate Type: red, [name] N/A N/A Exudate Color: Small (1-33%) N/A N/A Granulation Amount: Red N/A N/A Granulation Quality: Large (67-100%) N/A N/A Necrotic Amount: Eschar, Adherent [facility]  N/A N/A Necrotic Tissue: Fat Layer (Subcutaneous Tissue): Yes N/A N/A Exposed Structures: Fascia: No Tendon: No Muscle: No Joint: No Bone: No None N/A N/A Epithelialization: Debridement - Selective/Open Wound N/A N/A Debridement: Pre-procedure Verification/Time Out 08:50 N/A N/A Taken: Lidocaine 5% topical ointment N/A N/A Pain Control: Necrotic/Eschar, Slough N/A N/A Tissue Debrided: Non-Viable Tissue N/A N/A Level: 19.5 N/A N/A Debridement A (sq cm): rea Curette N/A N/A Instrument: Minimum N/A N/A Bleeding: Pressure N/A N/A Hemostasis A chieved: 0 N/A N/A Procedural Pain: 0 N/A N/A Post Procedural Pain: Procedure was tolerated well N/A N/A Debridement Treatment Response: 6.5x3x0.1 N/A N/A Post Debridement Measurements L x W x D (cm) 1.532 N/A N/A Post  Debridement Volume: (cm) Dry/Scaly: No N/A N/A Periwound Skin Moisture: Erythema: Yes N/A N/A Periwound Skin Color: Hemosiderin Staining: No Circumferential N/A N/A Erythema Location: No Abnormality N/A N/A Temperature: Debridement N/A N/A Procedures Performed: Treatment Notes Electronic Signature(s) Signed: 04/01/2022 9:09:33 AM By: Fredirick Maudlin MD FACS Entered By: Fredirick Maudlin on 04/01/2022 09:09:33 -------------------------------------------------------------------------------- Multi-Disciplinary Care Plan Details Patient Name: Date of Service: [name]. 04/01/2022 8:00 A M Medical Record Number: [id number] Patient Account Number: [id number] Date of Birth/Sex: Treating RN: Aug 13, 1960 (62 y.o. [name] Primary Care Emmakate Hypes: Terrill Mohr Other Clinician: Referring Dunya Meiners: Treating Greogory Cornette/Extender: Serita Kyle Weeks in Treatment: 0 Active Inactive Wound/Skin Impairment Nursing Diagnoses: Impaired tissue integrity Goals: Patient/caregiver will verbalize understanding of skin care regimen Date Initiated: 04/01/2022 Target Resolution Date: 06/08/2022 Goal Status: Active Interventions: Assess ulceration(s) every visit Treatment Activities: Skin care regimen initiated : 04/01/2022 ROSANN, TRUEBA ([id number]) [id number] Page 6 of 8 Notes: Electronic Signature(s) Signed: 04/01/2022 5:34:15 PM By: Tina Catholic RN Entered By: [name] on 04/01/2022 17:30:44 -------------------------------------------------------------------------------- Pain Assessment Details Patient Name: Date of Service: [name]. 04/01/2022 8:00 A M Medical Record Number: [id number] Patient Account Number: [id number] Date of Birth/Sex: Treating RN: [birth year] (62 y.o. [name] Primary Care Dasie Chancellor: Terrill Mohr Other Clinician: Referring Seham Gardenhire: Treating Izeah Vossler/Extender: Serita Kyle Weeks in Treatment: 0 Active Problems Location of Pain Severity and Description of Pain Patient Has Paino Yes Site Locations Pain Location: Pain in Ulcers With Dressing Change: No Duration of the Pain. Constant / Intermittento Constant Rate the pain. Current Pain Level: 8 Worst Pain Level: 10 Least Pain Level: 3 Tolerable Pain Level: 7 Character of Pain Describe the Pain: Difficult to Pinpoint Pain Management and Medication Current Pain Management: Medication: Yes Cold Application: No Rest: Yes Massage: No Activity: No T.E.N.S.: No Heat Application: No Leg drop or elevation: No Is the Current Pain Management Adequate: Adequate How does your wound impact your activities of daily livingo Sleep: No Bathing: No Appetite: No Relationship With Others: No Bladder Continence: No Emotions: No Bowel Continence: No Work: No Toileting: No Drive: No Dressing: No Hobbies: No Electronic Signature(s) Signed: 04/01/2022 5:34:15 PM By: Tina Catholic RN Entered By: [name] on 04/01/2022 08:52:15 [name] ([id number] Page 7 of 8 -------------------------------------------------------------------------------- Patient/Caregiver Education Details Patient Name: Date of Service: [name]. 2/22/2024andnbsp8:00 A M Medical Record Number: [id number] Patient Account Number: [id number] Date of Birth/Gender: Treating RN: 1960/10/08 (62 y.o. [name] Primary Care Physician: Terrill Mohr Other Clinician: Referring Physician: Treating Physician/Extender: Renaldo Harrison in Treatment: 0 Education Assessment Education Provided To: Patient Education Topics Provided Wound/Skin Impairment: Methods: Explain/Verbal Responses: Return demonstration correctly Electronic Signature(s) Signed: 04/01/2022 5:34:15 PM By: Tina Catholic RN Entered By:  [name] on  04/01/2022 17:30:55 -------------------------------------------------------------------------------- Wound Assessment Details Patient Name: Date of Service: [name]. 04/01/2022 8:00 A M Medical Record Number: [id number] Patient Account Number: [id number] Date of Birth/Sex: Treating RN: December 06, 1960 (62 y.o. [name] Primary Care Rayane Gallardo: Terrill Mohr Other Clinician: Referring Szymon Foiles: Treating Halayna Blane/Extender: Serita Kyle Weeks in Treatment: 0 Wound Status Wound Number: 1 Primary Etiology: Venous Leg Ulcer Wound Location: Left, Medial Lower Leg Wound Status: Open Wounding Event: Blister Comorbid History: Hypertension Date Acquired: 03/25/2022 Weeks Of Treatment: 0 Clustered Wound: No Photos Wound Measurements Length: (cm) 6.5 Width: (cm) 3 Depth: (cm) 0.1 Area: (cm) 15.315 Volume: (cm) 1.532 Harbaugh, Delayza [name] ([id number]) % Reduction in Area: % Reduction in Volume: Epithelialization: None Tunneling: No Undermining: No [id number] Page 8 of 8 Wound Description Classification: Full Thickness Without Exposed Su Exudate Amount: Medium Exudate Type: Serosanguineous Exudate Color: red, [name] pport Structures Wound Bed Granulation Amount: Small (1-33%) Exposed Structure Granulation Quality: Red Fascia Exposed: No Necrotic Amount: Large (67-100%) Fat Layer (Subcutaneous Tissue) Exposed: Yes Necrotic Quality: Eschar, Adherent Slough Tendon Exposed: No Muscle Exposed: No Joint Exposed: No Bone Exposed: No Periwound Skin Texture Texture Color No Abnormalities Noted: No No Abnormalities Noted: No Erythema: Yes Moisture Erythema Location: Circumferential No Abnormalities Noted: No Hemosiderin Staining: No Dry / Scaly: No Temperature / Pain Temperature: No Abnormality Electronic Signature(s) Signed: 04/01/2022 5:34:15 PM By: Tina Catholic RN Entered By: [name] on 04/01/2022  08:51:26 -------------------------------------------------------------------------------- Vitals Details Patient Name: Date of Service: [name], [name]. 04/01/2022 8:00 A M Medical Record Number: [id number] Patient Account Number: [id number] Date of Birth/Sex: Treating RN: [birth year] (62 y.o. [name] Primary Care Wynne Rozak: Terrill Mohr Other Clinician: Referring Dmya Long: Treating Khalia Gong/Extender: Serita Kyle Weeks in Treatment: 0 Vital Signs Time Taken: 08:20 Temperature (F): 98.1 Height (in): 60 Pulse (bpm): 83 Weight (lbs): 198 Respiratory Rate (breaths/min): 18 Body Mass Index (BMI): 38.7 Blood Pressure (mmHg): 133/83 Reference Range: 80 - 120 mg / dl Electronic Signature(s) Signed: 04/01/2022 5:34:15 PM By: Tina Catholic RN Entered By: [name] on 04/01/2022 08:28:16
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
## 2022-04-09 ENCOUNTER — Encounter (HOSPITAL_BASED_OUTPATIENT_CLINIC_OR_DEPARTMENT_OTHER): Payer: Medicare HMO | Attending: General Surgery | Admitting: General Surgery

## 2022-04-09 DIAGNOSIS — Z89511 Acquired absence of right leg below knee: Secondary | ICD-10-CM | POA: Diagnosis not present

## 2022-04-09 DIAGNOSIS — E11622 Type 2 diabetes mellitus with other skin ulcer: Secondary | ICD-10-CM | POA: Insufficient documentation

## 2022-04-09 DIAGNOSIS — L97821 Non-pressure chronic ulcer of other part of left lower leg limited to breakdown of skin: Secondary | ICD-10-CM | POA: Diagnosis not present

## 2022-04-09 DIAGNOSIS — K760 Fatty (change of) liver, not elsewhere classified: Secondary | ICD-10-CM | POA: Diagnosis not present

## 2022-04-09 DIAGNOSIS — I10 Essential (primary) hypertension: Secondary | ICD-10-CM | POA: Insufficient documentation

## 2022-04-09 DIAGNOSIS — I890 Lymphedema, not elsewhere classified: Secondary | ICD-10-CM | POA: Insufficient documentation

## 2022-04-09 DIAGNOSIS — I872 Venous insufficiency (chronic) (peripheral): Secondary | ICD-10-CM | POA: Diagnosis not present

## 2022-04-09 DIAGNOSIS — E7800 Pure hypercholesterolemia, unspecified: Secondary | ICD-10-CM | POA: Diagnosis not present

## 2022-04-12 NOTE — Progress Notes (Signed)
NEHEMIAH, ZADRA ([id number]) [phone].[id number] Page 1 of 7 Visit Report for 04/09/2022 Arrival Information Details Patient Name: Date of Service: Tina Patch NICE K. 04/09/2022 8:30 A M Medical Record Number: [id number] Patient Account Number: [id number] Date of Birth/Sex: Treating RN: 12-17-1960 (62 y.o. [name] Primary Care [name]: Terrill Mohr Other Clinician: Referring [name]: Treating [name]/Extender: Renaldo Harrison in Treatment: 1 Visit Information History Since Last Visit Added or deleted any medications: No Patient Arrived: Ambulatory Any new allergies or adverse reactions: No Arrival Time: 08:17 Had a fall or experienced change in No Accompanied By: self activities of daily living that may affect Transfer Assistance: None risk of falls: Patient Identification Verified: Yes Signs or symptoms of abuse/neglect since last visito No Secondary Verification Process Completed: Yes Hospitalized since last visit: No Implantable device outside of the clinic excluding No cellular tissue based products placed in the center since last visit: Has Dressing in Place as Prescribed: Yes Has Compression in Place as Prescribed: Yes Pain Present Now: No Electronic Signature(s) Signed: 04/09/2022 3:24:46 PM By: Adline Peals Entered By: Adline Peals on 04/09/2022 08:19:21 -------------------------------------------------------------------------------- Clinic Level of Care Assessment Details Patient Name: Date of Service: Tina Patch NICE K. 04/09/2022 8:30 A M Medical Record Number: [id number] Patient Account Number: [id number] Date of Birth/Sex: Treating RN: Sep 02, 1960 (62 y.o. [name] Primary Care [name]: Terrill Mohr Other Clinician: Referring Mackinzee Roszak: Treating [name]/Extender: Renaldo Harrison in Treatment: 1 Clinic Level of Care Assessment Items TOOL 4 Quantity Score X-  1 0 Use when only an EandM is performed on FOLLOW-UP visit ASSESSMENTS - Nursing Assessment / Reassessment '[]'$  - 0 Reassessment of Co-morbidities (includes updates in patient status) '[]'$  - 0 Reassessment of Adherence to Treatment Plan ASSESSMENTS - Wound and Skin A ssessment / Reassessment X - Simple Wound Assessment / Reassessment - one wound 1 5 '[]'$  - 0 Complex Wound Assessment / Reassessment - multiple wounds '[]'$  - 0 Dermatologic / Skin Assessment (not related to wound area) ASSESSMENTS - Focused Assessment X- 1 5 Circumferential Edema Measurements - multi extremities '[]'$  - 0 Nutritional Assessment / Counseling / Intervention X- 1 5 Lower Extremity Assessment (monofilament, tuning fork, pulses) '[]'$  - 0 Peripheral Arterial Disease Assessment (using hand held doppler) ASSESSMENTS - Ostomy and/or Continence Assessment and Care '[]'$  - 0 Incontinence Assessment and Management '[]'$  - 0 Ostomy Care Assessment and Management (repouching, etc.) PROCESS - Coordination of Care [name] ([id number]) [phone].pdf Page 2 of 7 X- 1 15 Simple Patient / Family Education for ongoing care '[]'$  - 0 Complex (extensive) Patient / Family Education for ongoing care X- 1 10 Staff obtains Programmer, systems, Records, T Results / Process Orders est '[]'$  - 0 Staff telephones HHA, Nursing Homes / Clarify orders / etc '[]'$  - 0 Routine Transfer to another Facility (non-emergent condition) '[]'$  - 0 Routine Hospital Admission (non-emergent condition) '[]'$  - 0 New Admissions / Biomedical engineer / Ordering NPWT Apligraf, etc. , '[]'$  - 0 Emergency Hospital Admission (emergent condition) X- 1 10 Simple Discharge Coordination '[]'$  - 0 Complex (extensive) Discharge Coordination PROCESS - Special Needs '[]'$  - 0 Pediatric / Minor Patient Management '[]'$  - 0 Isolation Patient Management '[]'$  - 0 Hearing / Language / Visual special needs '[]'$  - 0 Assessment of Community assistance (transportation, D/C  planning, etc.) '[]'$  - 0 Additional assistance / Altered mentation '[]'$  - 0 Support Surface(s) Assessment (bed, cushion, seat, etc.) INTERVENTIONS - Wound Cleansing / Measurement X - Simple Wound Cleansing -  one wound 1 5 '[]'$  - 0 Complex Wound Cleansing - multiple wounds X- 1 5 Wound Imaging (photographs - any number of wounds) '[]'$  - 0 Wound Tracing (instead of photographs) '[]'$  - 0 Simple Wound Measurement - one wound '[]'$  - 0 Complex Wound Measurement - multiple wounds INTERVENTIONS - Wound Dressings '[]'$  - 0 Small Wound Dressing one or multiple wounds '[]'$  - 0 Medium Wound Dressing one or multiple wounds '[]'$  - 0 Large Wound Dressing one or multiple wounds '[]'$  - 0 Application of Medications - topical '[]'$  - 0 Application of Medications - injection INTERVENTIONS - Miscellaneous '[]'$  - 0 External ear exam '[]'$  - 0 Specimen Collection (cultures, biopsies, blood, body fluids, etc.) '[]'$  - 0 Specimen(s) / Culture(s) sent or taken to Lab for analysis '[]'$  - 0 Patient Transfer (multiple staff / Civil Service fast streamer / Similar devices) '[]'$  - 0 Simple Staple / Suture removal (25 or less) '[]'$  - 0 Complex Staple / Suture removal (26 or more) '[]'$  - 0 Hypo / Hyperglycemic Management (close monitor of Blood Glucose) '[]'$  - 0 Ankle / Brachial Index (ABI) - do not check if billed separately X- 1 5 Vital Signs Has the patient been seen at the hospital within the last three years: Yes Total Score: 65 Level Of Care: New/Established - Level 2 Electronic Signature(s) Signed: 04/09/2022 3:24:46 PM By: Adriana Mccallum ([id number]) [id number] Page 3 of 7 Entered By: Adline Peals on 04/09/2022 08:32:41 -------------------------------------------------------------------------------- Encounter Discharge Information Details Patient Name: Date of Service: Tina Patch NICE K. 04/09/2022 8:30 A M Medical Record Number: [id number] Patient Account Number: [id number] Date of Birth/Sex:  Treating RN: Nov 09, 1960 (62 y.o. [name] Primary Care [name]: Terrill Mohr Other Clinician: Referring Buford Bremer: Treating Tanaisha Pittman/Extender: Renaldo Harrison in Treatment: 1 Encounter Discharge Information Items Discharge Condition: Stable Ambulatory Status: Ambulatory Discharge Destination: Home Transportation: Private Auto Accompanied By: self Schedule Follow-up Appointment: No Clinical Summary of Care: Patient Declined Electronic Signature(s) Signed: 04/09/2022 3:24:46 PM By: Sabas Sous By: Adline Peals on 04/09/2022 08:33:04 -------------------------------------------------------------------------------- Lower Extremity Assessment Details Patient Name: Date of Service: Tina Patch NICE K. 04/09/2022 8:30 A M Medical Record Number: [id number] Patient Account Number: [id number] Date of Birth/Sex: Treating RN: [birth year] (62 y.o. [name] Primary Care Climmie Buelow: Terrill Mohr Other Clinician: Referring Mikeala Girdler: Treating Court Gracia/Extender: Serita Kyle Weeks in Treatment: 1 Edema Assessment Assessed: [Left: No] [Right: No] [Left: Edema] [Right: :] Calf Left: Right: Point of Measurement: 35 cm From Medial Instep 47 cm Ankle Left: Right: Point of Measurement: 10 cm From Medial Instep 27.5 cm Vascular Assessment Pulses: Dorsalis Pedis Palpable: [Left:Yes] Electronic Signature(s) Signed: 04/09/2022 3:24:46 PM By: Adline Peals Entered By: Adline Peals on 04/09/2022 08:24:25 -------------------------------------------------------------------------------- Multi Wound Chart Details Patient Name: Date of Service: Tina Patch NICE K. 04/09/2022 8:30 A M Medical Record Number: [id number] Patient Account Number: [id number] Date of Birth/Sex: Treating RN: October 23, 1960 (62 y.o. F) Primary Care Fontaine Kossman: Terrill Mohr Other Clinician: Referring Drew Lips: Treating Matei Magnone/Extender:  Stanisha, [name] ([id number]) [id number] Page 4 of 7 Weeks in Treatment: 1 Vital Signs Height(in): Pulse(bpm): 92 Weight(lbs): 198 Blood Pressure(mmHg): 120/74 Body Mass Index(BMI): Temperature(F): 97.8 Respiratory Rate(breaths/min): 16 [1:Photos:] [N/A:N/A] Left, Medial Lower Leg N/A N/A Wound Location: Blister N/A N/A Wounding Event: Venous Leg Ulcer N/A N/A Primary Etiology: Hypertension N/A N/A Comorbid History: 03/25/2022 N/A N/A Date Acquired: 1 N/A N/A Weeks of Treatment: Healed - Epithelialized N/A N/A Wound Status: No N/A N/A  Wound Recurrence: 0x0x0 N/A N/A Measurements L x W x D (cm) 0 N/A N/A A (cm) : rea 0 N/A N/A Volume (cm) : 100.00% N/A N/A % Reduction in A rea: 100.00% N/A N/A % Reduction in Volume: Full Thickness Without Exposed N/A N/A Classification: Support Structures None Present N/A N/A Exudate Amount: Distinct, outline attached N/A N/A Wound Margin: None Present (0%) N/A N/A Granulation Amount: None Present (0%) N/A N/A Necrotic Amount: Fascia: No N/A N/A Exposed Structures: Fat Layer (Subcutaneous Tissue): No Tendon: No Muscle: No Joint: No Bone: No Large (67-100%) N/A N/A Epithelialization: Scarring: Yes N/A N/A Periwound Skin Texture: Dry/Scaly: No N/A N/A Periwound Skin Moisture: Erythema: Yes N/A N/A Periwound Skin Color: Hemosiderin Staining: No No Abnormality N/A N/A Temperature: Treatment Notes Electronic Signature(s) Signed: 04/09/2022 8:34:13 AM By: Fredirick Maudlin MD FACS Entered By: Fredirick Maudlin on 04/09/2022 08:34:13 -------------------------------------------------------------------------------- Multi-Disciplinary Care Plan Details Patient Name: Date of Service: Tina Patch NICE K. 04/09/2022 8:30 A M Medical Record Number: [id number] Patient Account Number: [id number] Date of Birth/Sex: Treating RN: 06/10/1960 (62 y.o. [name] Primary Care Ezzie Senat: Terrill Mohr Other Clinician: Referring Fontaine Hehl: Treating Shonia Skilling/Extender: Serita Kyle Weeks in Treatment: 1 Active Inactive Electronic Signature(s) Tina, [name] ([id number]) [id number] Page 5 of 7 Signed: 04/09/2022 3:24:46 PM By: Adline Peals Entered By: Adline Peals on 04/09/2022 08:32:17 -------------------------------------------------------------------------------- Pain Assessment Details Patient Name: Date of Service: Tina Patch NICE K. 04/09/2022 8:30 A M Medical Record Number: [id number] Patient Account Number: [id number] Date of Birth/Sex: Treating RN: 06-01-1960 (62 y.o. [name] Primary Care Kortni Hasten: Terrill Mohr Other Clinician: Referring Kori Colin: Treating Eunique Balik/Extender: Serita Kyle Weeks in Treatment: 1 Active Problems Location of Pain Severity and Description of Pain Patient Has Paino No Site Locations Rate the pain. Current Pain Level: 0 Pain Management and Medication Current Pain Management: Electronic Signature(s) Signed: 04/09/2022 3:24:46 PM By: Adline Peals Entered By: Adline Peals on 04/09/2022 08:19:48 -------------------------------------------------------------------------------- Patient/Caregiver Education Details Patient Name: Date of Service: [name] 3/1/2024andnbsp8:30 [place] Record Number: [id number] Patient Account Number: [id number] Date of Birth/Gender: Treating RN: 01-19-1961 (62 y.o. [name] Primary Care Physician: Terrill Mohr Other Clinician: Referring Physician: Treating Physician/Extender: Renaldo Harrison in Treatment: 1 Education Assessment Education Provided To: Patient Education Topics Provided Wound/Skin Impairment: Methods: Explain/Verbal Responses: Reinforcements needed, State content correctly Electronic  Signature(s) Signed: 04/09/2022 3:24:46 PM By: Adriana Mccallum ([id number]) [id number] Page 6 of 7 Entered By: Adline Peals on 04/09/2022 08:21:02 -------------------------------------------------------------------------------- Wound Assessment Details Patient Name: Date of Service: Tina Patch NICE K. 04/09/2022 8:30 A M Medical Record Number: [id number] Patient Account Number: [id number] Date of Birth/Sex: Treating RN: 1960/08/18 (62 y.o. [name] Primary Care Raheen Capili: Terrill Mohr Other Clinician: Referring Kendrix Orman: Treating Quintella Mura/Extender: Serita Kyle Weeks in Treatment: 1 Wound Status Wound Number: 1 Primary Etiology: Venous Leg Ulcer Wound Location: Left, Medial Lower Leg Wound Status: Healed - Epithelialized Wounding Event: Blister Comorbid History: Hypertension Date Acquired: 03/25/2022 Weeks Of Treatment: 1 Clustered Wound: No Photos Wound Measurements Length: (cm) Width: (cm) Depth: (cm) Area: (cm) Volume: (cm) 0 % Reduction in Area: 100% 0 % Reduction in Volume: 100% 0 Epithelialization: Large (67-100%) 0 Tunneling: No 0 Undermining: No Wound Description Classification: Full Thickness Without Exposed Suppor Wound Margin: Distinct, outline attached Exudate Amount: None Present t Structures Foul Odor After Cleansing: No Slough/Fibrino No Wound Bed Granulation Amount: None Present (0%) Exposed Structure Necrotic  Amount: None Present (0%) Fascia Exposed: No Fat Layer (Subcutaneous Tissue) Exposed: No Tendon Exposed: No Muscle Exposed: No Joint Exposed: No Bone Exposed: No Periwound Skin Texture Texture Color No Abnormalities Noted: No No Abnormalities Noted: Yes Scarring: Yes Temperature / Pain Temperature: No Abnormality Moisture No Abnormalities Noted: Yes Electronic Signature(s) Signed: 04/09/2022 3:24:46 PM By: Adline Peals Entered By: Adline Peals  on 04/09/2022 08:30:21 Vitals Details -------------------------------------------------------------------------------- [name] ([id number]) [id number] Page 7 of 7 Patient Name: Date of Service: Tina Patch NICE K. 04/09/2022 8:30 A M Medical Record Number: [id number] Patient Account Number: [id number] Date of Birth/Sex: Treating RN: Jun 16, 1960 (62 y.o. [name] Primary Care Brooks Stotz: Terrill Mohr Other Clinician: Referring Abdoulie Tierce: Treating Tavarius Grewe/Extender: Serita Kyle Weeks in Treatment: 1 Vital Signs Time Taken: 08:19 Temperature (F): 97.8 Weight (lbs): 198 Pulse (bpm): 92 Respiratory Rate (breaths/min): 16 Blood Pressure (mmHg): 120/74 Reference Range: 80 - 120 mg / dl Electronic Signature(s) Signed: 04/09/2022 3:24:46 PM By: Adline Peals Entered By: Adline Peals on 04/09/2022 08:19:43

## 2022-04-13 ENCOUNTER — Encounter (HOSPITAL_BASED_OUTPATIENT_CLINIC_OR_DEPARTMENT_OTHER): Payer: Medicare HMO | Admitting: General Surgery
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
## 2022-04-21 ENCOUNTER — Encounter (HOSPITAL_BASED_OUTPATIENT_CLINIC_OR_DEPARTMENT_OTHER): Payer: Medicare HMO | Admitting: Internal Medicine

## 2022-04-21 DIAGNOSIS — I890 Lymphedema, not elsewhere classified: Secondary | ICD-10-CM | POA: Diagnosis not present

## 2022-04-21 DIAGNOSIS — L97821 Non-pressure chronic ulcer of other part of left lower leg limited to breakdown of skin: Secondary | ICD-10-CM

## 2022-04-21 DIAGNOSIS — E11622 Type 2 diabetes mellitus with other skin ulcer: Secondary | ICD-10-CM

## 2022-04-21 DIAGNOSIS — I87312 Chronic venous hypertension (idiopathic) with ulcer of left lower extremity: Secondary | ICD-10-CM
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
## 2022-04-22 NOTE — Progress Notes (Addendum)
[name] ([id number]) [phone].pdf Page 1 of 7 Visit Report for 04/21/2022 Arrival Information Details Patient Name: Date of Service: Tina Skeen NICE K. 04/21/2022 8:45 A M Medical Record Number: [id number] Patient Account Number: [id number] Date of Birth/Sex: Treating RN: 08-Sep-1960 (62 y.o. F) Primary Care Lanyiah Brix: Brett Fairy Other Clinician: Referring Sherrelle Prochazka: Treating Rayce Brahmbhatt/Extender: Claudette Head Weeks in Treatment: 2 Visit Information History Since Last Visit All ordered tests and consults were completed: No Patient Arrived: Ambulatory Added or deleted any medications: No Arrival Time: 09:05 Any new allergies or adverse reactions: No Accompanied By: self Had a fall or experienced change in No Transfer Assistance: None activities of daily living that may affect Patient Identification Verified: Yes risk of falls: Secondary Verification Process Completed: Yes Signs or symptoms of abuse/neglect since last visito No Hospitalized since last visit: No Implantable device outside of the clinic excluding No cellular tissue based products placed in the center since last visit: Pain Present Now: No Electronic Signature(s) Signed: 04/21/2022 1:30:17 PM By: Dayton Scrape Entered By: Dayton Scrape on 04/21/2022 09:05:33 -------------------------------------------------------------------------------- Compression Therapy Details Patient Name: Date of Service: Tina Skeen NICE K. 04/21/2022 8:45 A M Medical Record Number: [id number] Patient Account Number: [id number] Date of Birth/Sex: Treating RN: April 09, 1960 (61 y.o. [name] Primary Care Terease Marcotte: Brett Fairy Other Clinician: Referring Fillmore Bynum: Treating Michaelah Credeur/Extender: Claudette Head Weeks in Treatment: 2 Compression Therapy Performed for Wound Assessment: Wound #3 Left,Anterior Lower Leg Performed By: Clinician Tommie Ard, RN Compression Type:  Three Layer Post Procedure Diagnosis Same as Pre-procedure Electronic Signature(s) Signed: 04/21/2022 4:29:19 PM By: Tommie Ard RN Entered By: Tommie Ard on 04/21/2022 09:50:22 -------------------------------------------------------------------------------- Compression Therapy Details Patient Name: Date of Service: Tina Skeen NICE K. 04/21/2022 8:45 A M Medical Record Number: [id number] Patient Account Number: [id number] Date of Birth/Sex: Treating RN: 02-23-1960 (61 y.o. [name] Primary Care Doriana Mazurkiewicz: Brett Fairy Other Clinician: Referring Carigan Lister: Treating Blakeleigh Domek/Extender: Claudette Head Weeks in Treatment: 2 Compression Therapy Performed for Wound Assessment: Wound #2 Left,Medial Lower Leg Performed By: Clinician Tommie Ard, RN Compression Type: Three Layer Post Procedure Diagnosis Same as KIELEE, BUI ([id number]) [phone] Page 2 of 7 Electronic Signature(s) Signed: 04/21/2022 4:29:19 PM By: Tommie Ard RN Entered By: Tommie Ard on 04/21/2022 09:50:22 -------------------------------------------------------------------------------- Encounter Discharge Information Details Patient Name: Date of Service: Tina Skeen NICE K. 04/21/2022 8:45 A M Medical Record Number: [id number] Patient Account Number: [id number] Date of Birth/Sex: Treating RN: 19-Jun-1960 (61 y.o. [name] Primary Care Nyilah Kight: Brett Fairy Other Clinician: Referring Verita Kuroda: Treating Itza Maniaci/Extender: Otila Kluver in Treatment: 2 Encounter Discharge Information Items Discharge Condition: Stable Ambulatory Status: Ambulatory Discharge Destination: Home Transportation: Private Auto Accompanied By: self Schedule Follow-up Appointment: Yes Clinical Summary of Care: Electronic Signature(s) Signed: 04/21/2022 12:04:05 PM By: Tommie Ard RN Entered By: Tommie Ard on 04/21/2022  12:04:05 -------------------------------------------------------------------------------- Lower Extremity Assessment Details Patient Name: Date of Service: Tina Skeen NICE K. 04/21/2022 8:45 A M Medical Record Number: [id number] Patient Account Number: [id number] Date of Birth/Sex: Treating RN: 1960/09/25 (61 y.o. [name] Primary Care Lilibeth Opie: Brett Fairy Other Clinician: Referring Zethan Alfieri: Treating Kinslie Hove/Extender: Claudette Head Weeks in Treatment: 2 Edema Assessment Assessed: [Left: No] [Right: No] [Left: Edema] [Right: :] Calf Left: Right: Point of Measurement: 35 cm From Medial Instep 49 cm Ankle Left: Right: Point of Measurement: 10 cm From Medial Instep 29 cm Vascular Assessment Pulses: Dorsalis Pedis Palpable: [Left:Yes] Electronic Signature(s)  Signed: 04/21/2022 4:29:19 PM By: Tommie Ard RN Entered By: Tommie Ard on 04/21/2022 09:24:08 -------------------------------------------------------------------------------- Multi Wound Chart Details Patient Name: Date of Service: [name], [name] NICE K. 04/21/2022 8:45 A Archer Asa ([id number]) [id number] Page 3 of 7 Medical Record Number: [id number] Patient Account Number: [id number] Date of Birth/Sex: Treating RN: [birth year] (62 y.o. F) Primary Care Haniyyah Sakuma: Brett Fairy Other Clinician: Referring Raybon Conard: Treating Gisell Buehrle/Extender: Claudette Head Weeks in Treatment: 2 Vital Signs Height(in): 60 Pulse(bpm): 115 Weight(lbs): 198 Blood Pressure(mmHg): 138/79 Body Mass Index(BMI): 38.7 Temperature(F): 97.6 Respiratory Rate(breaths/min): 20 [2:Photos:] [N/A:N/A] Left, Medial Lower Leg Left, Anterior Lower Leg N/A Wound Location: Blister Blister N/A Wounding Event: Venous Leg Ulcer Venous Leg Ulcer N/A Primary Etiology: Hypertension Hypertension N/A Comorbid History: 04/18/2022 04/18/2022 N/A Date Acquired: 0 0 N/A Weeks of  Treatment: Open Open N/A Wound Status: No No N/A Wound Recurrence: 7x1.6x0.1 1x2.4x0.1 N/A Measurements L x W x D (cm) 8.796 1.885 N/A A (cm) : rea 0.88 0.188 N/A Volume (cm) : Full Thickness Without Exposed Full Thickness Without Exposed N/A Classification: Support Structures Support Structures Medium Medium N/A Exudate Amount: Serous Serous N/A Exudate Type: Media planner N/A Exudate Color: Medium (34-66%) Medium (34-66%) N/A Granulation Amount: Pink N/A N/A Granulation Quality: Medium (34-66%) Medium (34-66%) N/A Necrotic Amount: Fat Layer (Subcutaneous Tissue): Yes Fat Layer (Subcutaneous Tissue): Yes N/A Exposed Structures: Fascia: No Fascia: No Tendon: No Tendon: No Muscle: No Muscle: No Joint: No Joint: No Bone: No Bone: No Small (1-33%) Small (1-33%) N/A Epithelialization: No Abnormalities Noted N/A Periwound Skin Texture: Maceration: Yes N/A Periwound Skin Moisture: Dry/Scaly: No Rubor: Yes Rubor: Yes N/A Periwound Skin Color: No Abnormality No Abnormality N/A Temperature: Treatment Notes Electronic Signature(s) Signed: 04/21/2022 11:58:37 AM By: Geralyn Corwin DO Entered By: Geralyn Corwin on 04/21/2022 09:44:16 -------------------------------------------------------------------------------- Multi-Disciplinary Care Plan Details Patient Name: Date of Service: Tina Skeen NICE K. 04/21/2022 8:45 A M Medical Record Number: [id number] Patient Account Number: [id number] Date of Birth/Sex: Treating RN: [birth year] (61 y.o. [name] Primary Care Kaylib Furness: Brett Fairy Other Clinician: Referring Loucille Takach: Treating Shadee Montoya/Extender: Claudette Head Weeks in Treatment: [address] NAKEYIA, MARNER ([id number]) [id number] Page 4 of 7 Electronic Signature(s) Signed: 05/11/2022 2:35:46 PM By: Shawn Stall RN, BSN Signed: 06/10/2022 2:05:42 PM By: Tommie Ard RN Previous Signature: 04/21/2022  4:29:19 PM Version By: Tommie Ard RN Entered By: Shawn Stall on 05/11/2022 14:35:45 -------------------------------------------------------------------------------- Pain Assessment Details Patient Name: Date of Service: Tina Skeen NICE K. 04/21/2022 8:45 A M Medical Record Number: [id number] Patient Account Number: [id number] Date of Birth/Sex: Treating RN: Aug 19, 1960 (61 y.o. F) Primary Care Ines Warf: Brett Fairy Other Clinician: Referring Aylan Bayona: Treating Alexas Basulto/Extender: Claudette Head Weeks in Treatment: 2 Active Problems Location of Pain Severity and Description of Pain Patient Has Paino Yes Site Locations Rate the pain. Current Pain Level: 8 Worst Pain Level: 10 Least Pain Level: 0 Tolerable Pain Level: 2 Pain Management and Medication Current Pain Management: Electronic Signature(s) Signed: 04/21/2022 1:30:17 PM By: Dayton Scrape Entered By: Dayton Scrape on 04/21/2022 09:06:22 -------------------------------------------------------------------------------- Patient/Caregiver Education Details Patient Name: Date of Service: Tina Skeen NICE K. 3/13/2024andnbsp8:45 A M Medical Record Number: [id number] Patient Account Number: [id number] Date of Birth/Gender: Treating RN: [birth year] (62 y.o. [name] Primary Care Physician: Brett Fairy Other Clinician: Referring Physician: Treating Physician/Extender: Otila Kluver in Treatment: 2 Education Assessment Education Provided To: Patient Education Topics Provided Wound Debridement: Methods: Explain/Verbal Responses: Reinforcements needed, State  content correctly ANELIA, RAILEY ([id number]) [phone].pdf Page 5 of 7 Wound/Skin Impairment: Methods: Explain/Verbal Responses: Reinforcements needed, State content correctly Electronic Signature(s) Signed: 04/21/2022 4:29:19 PM By: Tommie Ard RN Entered By: Tommie Ard on 04/21/2022  12:03:07 -------------------------------------------------------------------------------- Wound Assessment Details Patient Name: Date of Service: Tina Skeen NICE K. 04/21/2022 8:45 A M Medical Record Number: [id number] Patient Account Number: [id number] Date of Birth/Sex: Treating RN: [birth year] (61 y.o. Roselee Nova, Jamie Primary Care Davyn Elsasser: Brett Fairy Other Clinician: Referring Nikeia Henkes: Treating Marshayla Mitschke/Extender: Claudette Head Weeks in Treatment: 2 Wound Status Wound Number: 2 Primary Etiology: Venous Leg Ulcer Wound Location: Left, Medial Lower Leg Wound Status: Open Wounding Event: Blister Comorbid History: Hypertension Date Acquired: 04/18/2022 Weeks Of Treatment: 0 Clustered Wound: No Photos Wound Measurements Length: (cm) 7 Width: (cm) 1.6 Depth: (cm) 0.1 Area: (cm) 8.796 Volume: (cm) 0.88 % Reduction in Area: % Reduction in Volume: Epithelialization: Small (1-33%) Undermining: No Wound Description Classification: Full Thickness Without Exposed Suppor Exudate Amount: Medium Exudate Type: Serous Exudate Color: amber t Structures Foul Odor After Cleansing: No Slough/Fibrino Yes Wound Bed Granulation Amount: Medium (34-66%) Exposed Structure Granulation Quality: Pink Fascia Exposed: No Necrotic Amount: Medium (34-66%) Fat Layer (Subcutaneous Tissue) Exposed: Yes Necrotic Quality: Adherent Slough Tendon Exposed: No Muscle Exposed: No Joint Exposed: No Bone Exposed: No Periwound Skin Texture Texture Color No Abnormalities Noted: No No Abnormalities Noted: No Rubor: Yes Moisture No Abnormalities Noted: No Temperature / Pain Dry / Scaly: No Temperature: No Abnormality MacerationLELA, FARRAN ([id number]) [phone].pdf Page 6 of 7 Electronic Signature(s) Signed: 04/21/2022 4:29:19 PM By: Tommie Ard RN Entered By: Tommie Ard on 04/21/2022  09:33:02 -------------------------------------------------------------------------------- Wound Assessment Details Patient Name: Date of Service: Tina Skeen NICE K. 04/21/2022 8:45 A M Medical Record Number: [id number] Patient Account Number: [id number] Date of Birth/Sex: Treating RN: 1960/07/10 (61 y.o. Roselee Nova, Jamie Primary Care Lazarus Sudbury: Brett Fairy Other Clinician: Referring Diesha Rostad: Treating Naser Schuld/Extender: Claudette Head Weeks in Treatment: 2 Wound Status Wound Number: 3 Primary Etiology: Venous Leg Ulcer Wound Location: Left, Anterior Lower Leg Wound Status: Open Wounding Event: Blister Comorbid History: Hypertension Date Acquired: 04/18/2022 Weeks Of Treatment: 0 Clustered Wound: No Photos Wound Measurements Length: (cm) 1 Width: (cm) 2.4 Depth: (cm) 0.1 Area: (cm) 1.885 Volume: (cm) 0.188 % Reduction in Area: % Reduction in Volume: Epithelialization: Small (1-33%) Tunneling: No Undermining: No Wound Description Classification: Full Thickness Without Exposed Support Structures Exudate Amount: Medium Exudate Type: Serous Exudate Color: amber Foul Odor After Cleansing: No Slough/Fibrino Yes Wound Bed Granulation Amount: Medium (34-66%) Exposed Structure Necrotic Amount: Medium (34-66%) Fascia Exposed: No Necrotic Quality: Adherent Slough Fat Layer (Subcutaneous Tissue) Exposed: Yes Tendon Exposed: No Muscle Exposed: No Joint Exposed: No Bone Exposed: No Periwound Skin Texture Texture Color No Abnormalities Noted: Yes No Abnormalities Noted: No Rubor: Yes Moisture No Abnormalities Noted: No Temperature / Pain Temperature: No Abnormality Electronic Signature(s) Signed: 04/21/2022 4:29:19 PM By: Tommie Ard RN Entered By: Tommie Ard on 04/21/2022 09:33:22 Drue Second ([id number]) [id number] Page 7 of 7 -------------------------------------------------------------------------------- Vitals  Details Patient Name: Date of Service: Tina Skeen NICE K. 04/21/2022 8:45 A M Medical Record Number: [id number] Patient Account Number: [id number] Date of Birth/Sex: Treating RN: [birth year] (62 y.o. F) Primary Care Dajanae Brophy: Brett Fairy Other Clinician: Referring Latrelle Fuston: Treating Armonie Mettler/Extender: Claudette Head Weeks in Treatment: 2 Vital Signs Time Taken: 09:05 Temperature (F): 97.6 Height (in): 60 Pulse (bpm): 115 Weight (lbs): 198 Respiratory Rate (breaths/min): 20 Body  Mass Index (BMI): 38.7 Blood Pressure (mmHg): 138/79 Reference Range: 80 - 120 mg / dl Electronic Signature(s) Signed: 04/21/2022 1:30:17 PM By: Dayton Scrape Entered By: Dayton Scrape on 04/21/2022 09:06:06

## 2022-04-29 ENCOUNTER — Ambulatory Visit (HOSPITAL_BASED_OUTPATIENT_CLINIC_OR_DEPARTMENT_OTHER): Payer: Medicare HMO | Admitting: General Surgery

## 2022-05-07 ENCOUNTER — Ambulatory Visit (HOSPITAL_BASED_OUTPATIENT_CLINIC_OR_DEPARTMENT_OTHER): Payer: Medicare HMO | Admitting: General Surgery

## 2022-05-12 DIAGNOSIS — M25641 Stiffness of right hand, not elsewhere classified: Secondary | ICD-10-CM | POA: Insufficient documentation

## 2022-11-11 DIAGNOSIS — R202 Paresthesia of skin: Secondary | ICD-10-CM | POA: Insufficient documentation

## 2022-12-15 ENCOUNTER — Telehealth: Payer: Self-pay | Admitting: Family Medicine

## 2022-12-15 ENCOUNTER — Ambulatory Visit: Payer: Medicare HMO | Admitting: Family Medicine

## 2022-12-15 NOTE — Telephone Encounter (Signed)
[name] was returning a call back from the office for her appt to be reschedule I tried rescheduling [name] and could not there where no appts showing up for dr Dimas Aguas [name] would like a call back regarding this matter.

## 2022-12-16 NOTE — Telephone Encounter (Signed)
Patient is calling back in to see about being rescheduled for her appt I was unable to reschedule patient

## 2023-01-11 ENCOUNTER — Ambulatory Visit: Payer: Medicare HMO | Admitting: Family Medicine

## 2023-02-01 ENCOUNTER — Ambulatory Visit: Payer: Medicare HMO | Admitting: Family Medicine

## 2023-02-21 IMAGING — CT CT HEAD W/O CM
4 series · 16 of 47 positions shown, 18 images · non-contrast
Comparison: None.

CLINICAL DATA: Trauma

EXAM:
CT HEAD WITHOUT CONTRAST
CT MAXILLOFACIAL WITHOUT CONTRAST
CT CERVICAL SPINE WITHOUT CONTRAST
TECHNIQUE: Multidetector CT imaging of the head, cervical spine, and
maxillofacial structures were performed using the standard protocol
without intravenous contrast. Multiplanar CT image reconstructions
of the cervical spine and maxillofacial structures were also
generated.

[Series 2: head wo · axial · 0.41mm/px · z∈[-296,-191]mm · 7 of 29 slices shown, 9 images]
[im 4/29  brain]
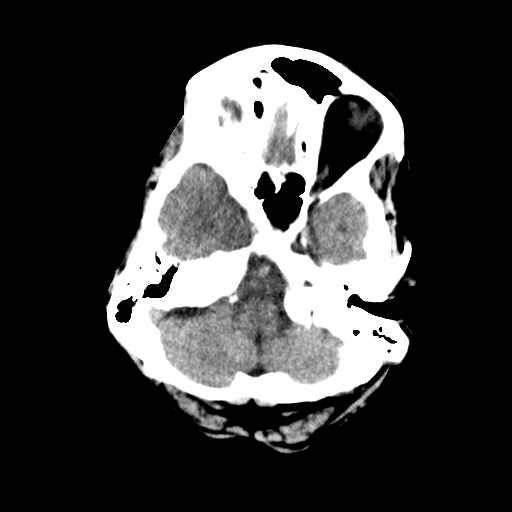
[im 4/29  bone]
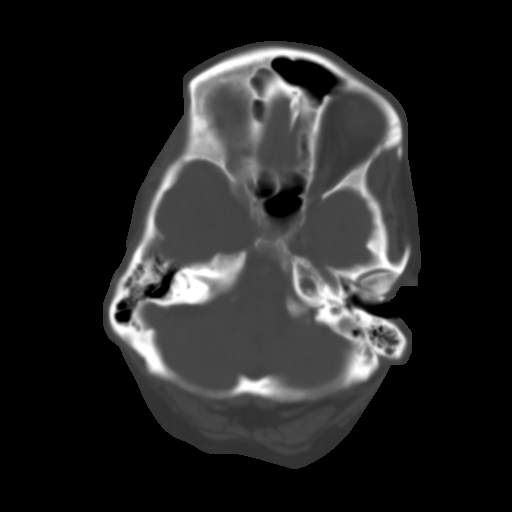
[im 8/29  brain]
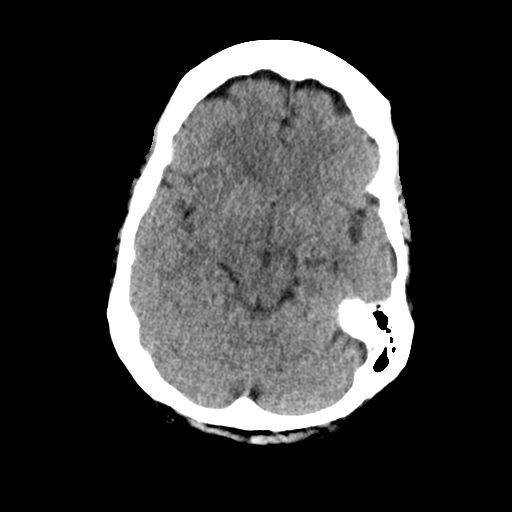
[im 11/29  brain]
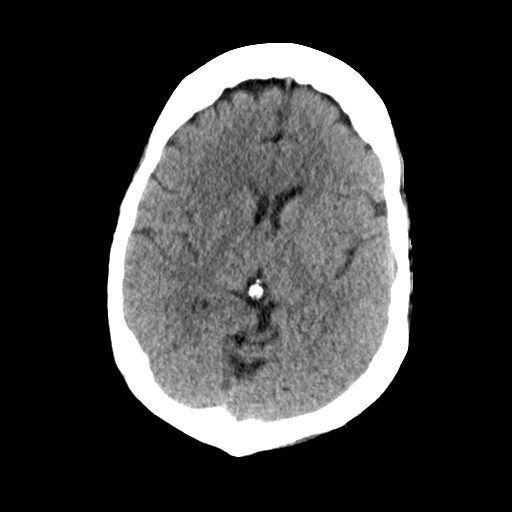
[im 15/29  brain]
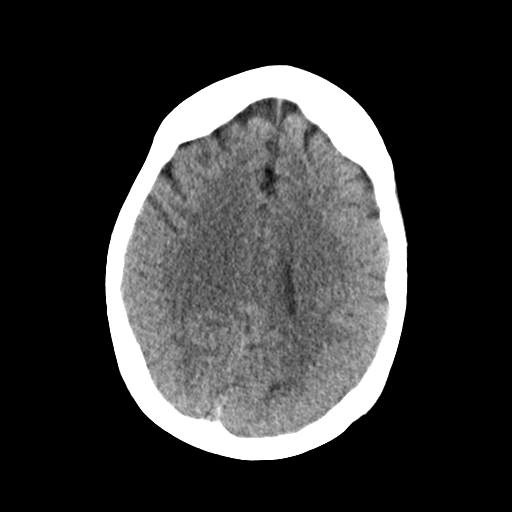
[im 18/29  brain]
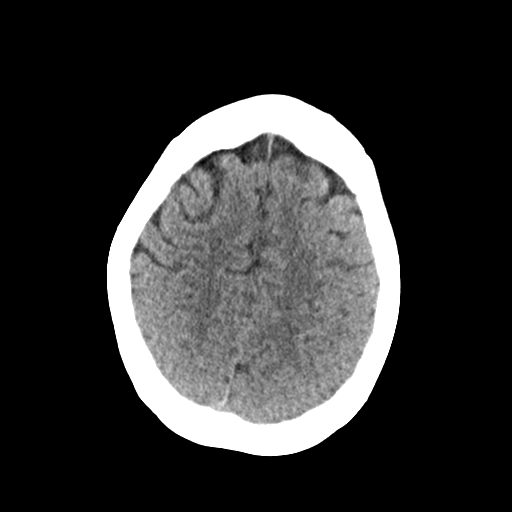
[im 18/29  bone]
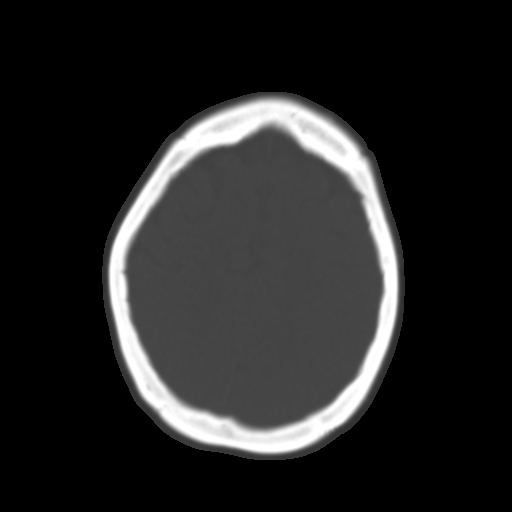
[im 22/29  brain]
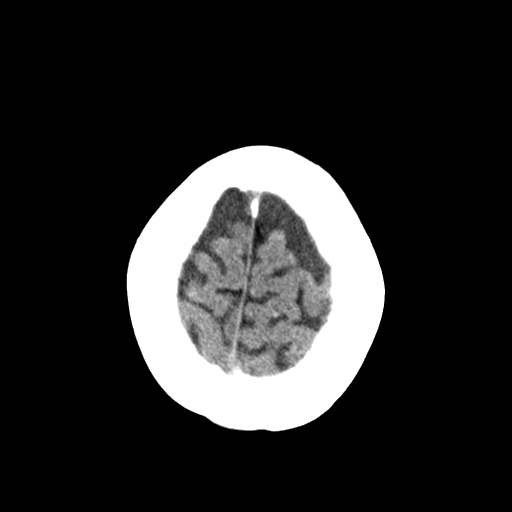
[im 25/29  brain]
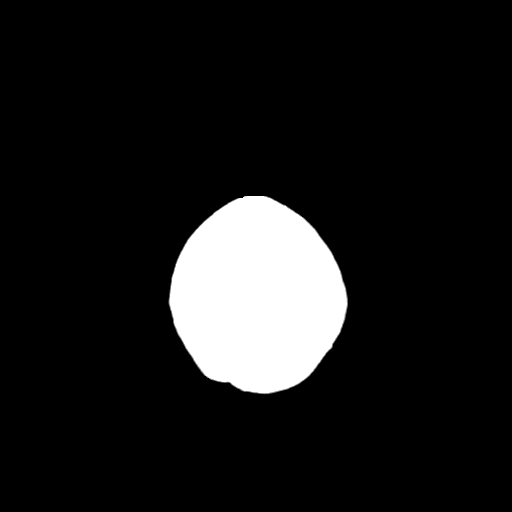

[Series 3: head bone · axial · 0.41mm/px · z∈[-297,-269]mm · 3 of 73 slices shown]
[im 8/73  bone]
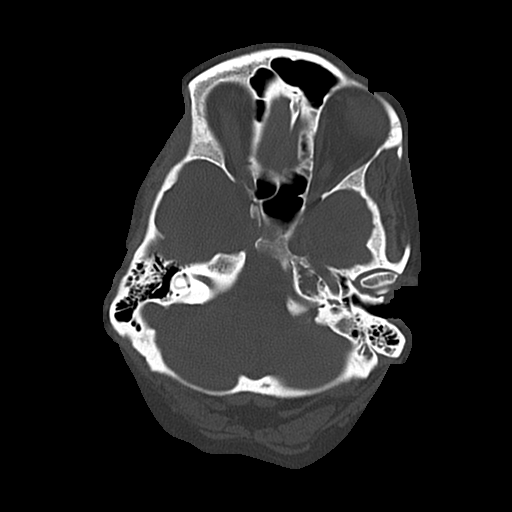
[im 15/73  bone]
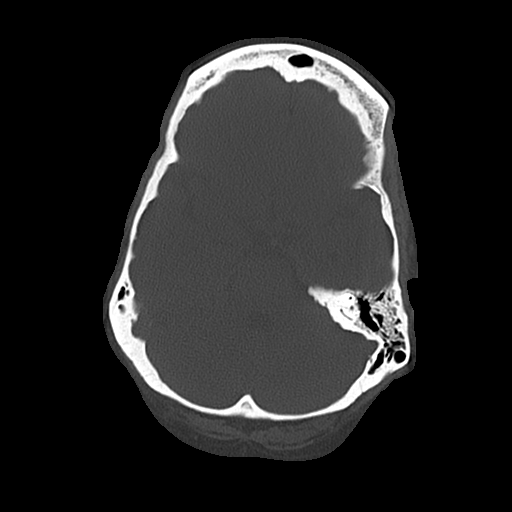
[im 22/73  bone]
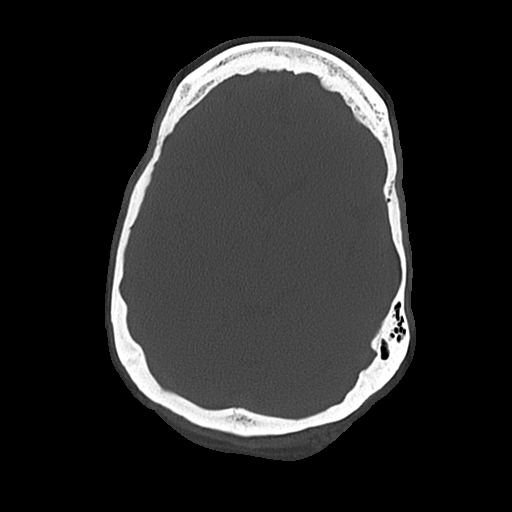

[Series 4: coronal soft · coronal · 0.31mm/px · 3 of 61 slices shown]
[im 21/61  brain]
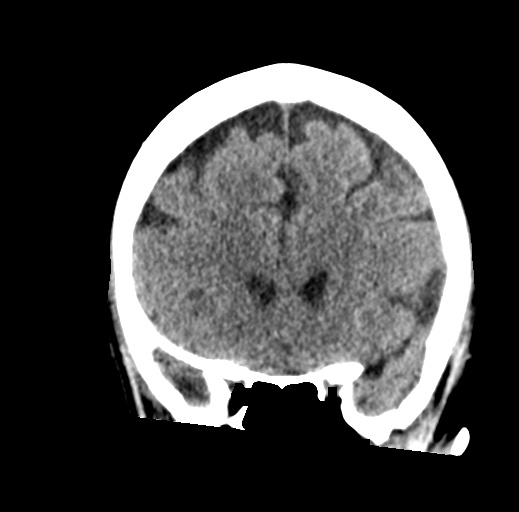
[im 27/61  brain]
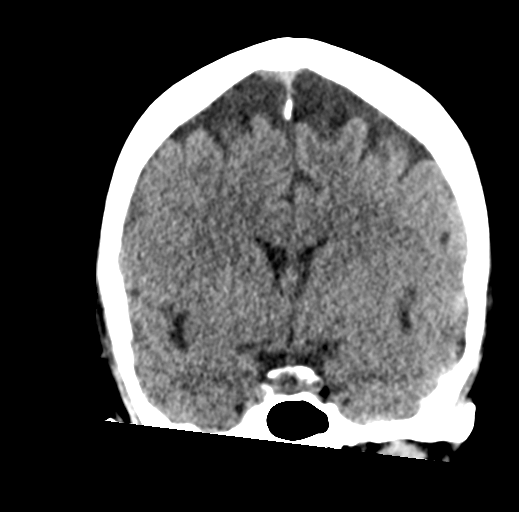
[im 34/61  brain]
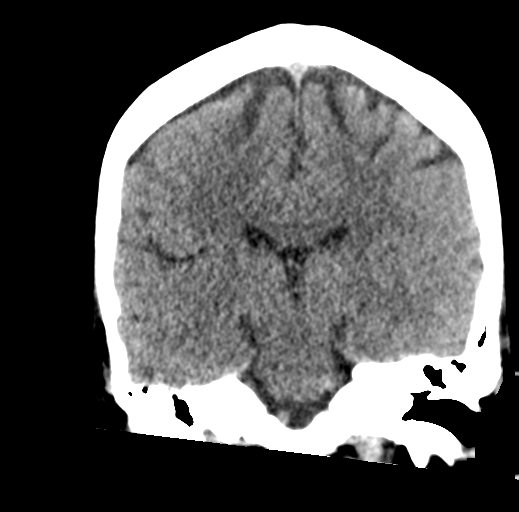

[Series 5: sagittal soft · sagittal · 0.31mm/px · 3 of 54 slices shown]
[im 18/54  brain]
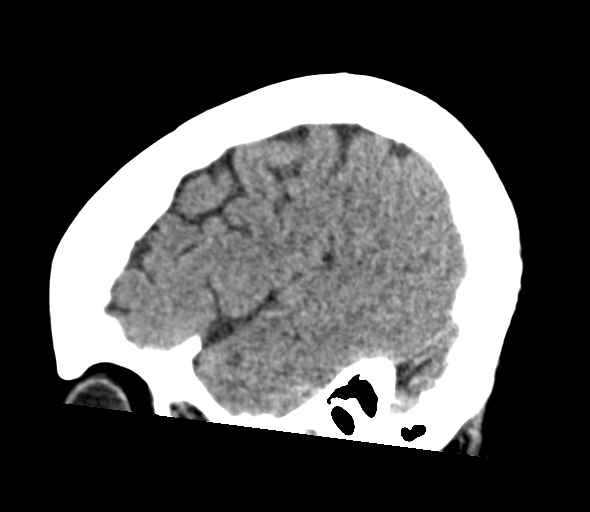
[im 27/54  brain]
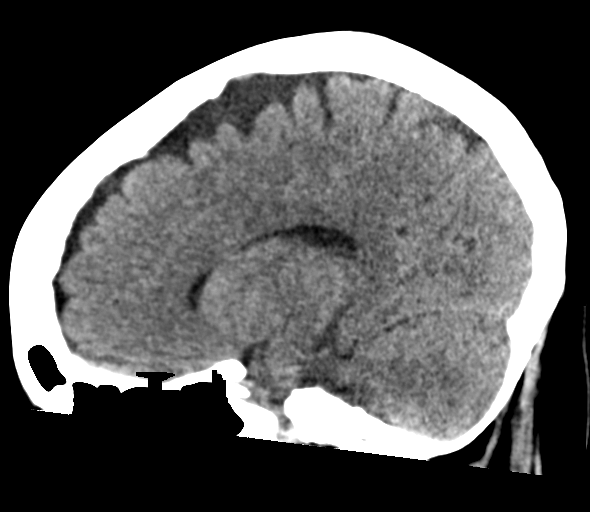
[im 36/54  brain]
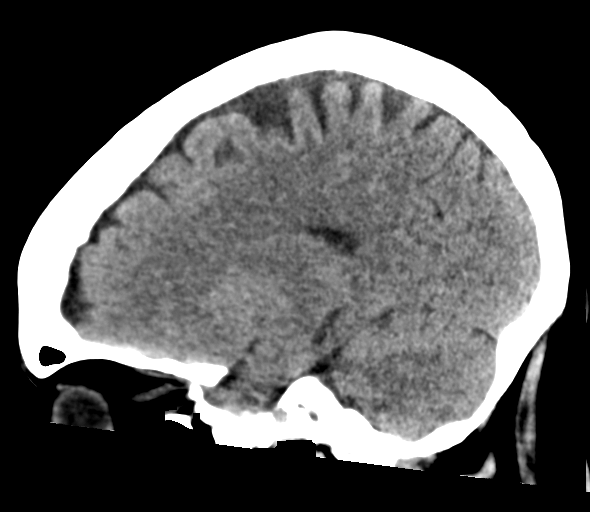

[16 of 47 positions shown; findings below may reference images not displayed]

FINDINGS: CT HEAD FINDINGS

Brain: No evidence of acute infarction, hemorrhage, hydrocephalus,
extra-axial collection or mass lesion/mass effect.

Vascular: No hyperdense vessel or unexpected calcification.

CT FACIAL BONES FINDINGS

Skull: Normal. Negative for fracture or focal lesion.

Facial bones: No displaced fractures or dislocations. Plate and
screw fixation of the mandibular body.

Sinuses/Orbits: No acute finding.

Other: Soft tissue contusion and laceration overlying the left orbit
(series 10, image 60).

CT CERVICAL SPINE FINDINGS

Alignment: Straightening of the normal cervical lordosis.

Skull base and vertebrae: No acute fracture. No primary bone lesion
or focal pathologic process.

Soft tissues and spinal canal: No prevertebral fluid or swelling. No
visible canal hematoma.

Disc levels: Mild disc space height loss and osteophytosis of the
lower cervical spine.

Upper chest: Negative.

Other: None.
IMPRESSION: 1. No acute intracranial pathology.
2. No displaced fractures or dislocations of the facial bones.
3. Soft tissue contusion and laceration overlying the left orbit.
4. No fracture or static subluxation of the cervical spine.

## 2023-02-28 ENCOUNTER — Ambulatory Visit: Payer: Self-pay | Admitting: Family Medicine

## 2023-02-28 NOTE — Telephone Encounter (Signed)
Copied from CRM [id number]. Topic: Clinical - Pink Word Triage >> Feb 28, 2023  8:23 AM [name] wrote: Reason for Triage: Blood Sugar/ Blood Pressure.  Pt is experiencing that she is craving salt more than normal and is concerned about her blood pressure and blood clotting. Pt has had the salt craving started about 2 weeks ago. Requesting callback  [phone]   Chief Complaint: Shortness of breath x 5 days Symptoms: gets winded walking household distance Frequency: constant Pertinent Negatives: Patient denies fever Disposition: [x] ED /[] Urgent Care (no appt availability in office) / [] Appointment(In office/virtual)/ []  [facility]/ [] Home Care/ [] Refused Recommended Disposition /[] [facility]/ []  Follow-up with PCP Additional Notes: Patient reports shortness of breath x 5 days when at rest and worse when walking in house. RN noted that patient sound out of breath while on call. 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 Pt not taking blood thinner at this time.   Reason for Disposition  [1] MODERATE difficulty breathing (e.g., speaks in phrases, SOB even at rest, pulse 100-120) AND [2] NEW-onset or WORSE than normal  Answer Assessment - Initial Assessment Questions 1. RESPIRATORY STATUS: "Describe your breathing?" (e.g., wheezing, shortness of breath, unable to speak, severe coughing)      Shortness of breath; gets short walking around the house   2. ONSET: "When did this breathing problem begin?"      Started 5 days ago  3. PATTERN "Does the difficult breathing come and go, or has it been constant since it started?"     Has been constant  4. SEVERITY: "How bad is your breathing?" (e.g., mild, moderate, severe)    - MILD: No SOB at rest, mild SOB with walking, speaks normally in sentences, can lie down, no retractions, pulse < 100.    - MODERATE: SOB at rest, SOB with minimal exertion and prefers to sit, cannot lie  down flat, speaks in phrases, mild retractions, audible wheezing, pulse 100-120.    - SEVERE: Very SOB at rest, speaks in single words, struggling to breathe, sitting hunched forward, retractions, pulse > 120      Moderate SOB, can also feel heart beating really fast  5. RECURRENT SYMPTOM: "Have you had difficulty breathing before?" If Yes, ask: "When was the last time?" and "What happened that time?"      Yes, the last time this happened it was a clot in lungs  6. CARDIAC HISTORY: "Do you have any history of heart disease?" (e.g., heart attack, angina, bypass surgery, angioplasty)      High Blood Pressue  7. LUNG HISTORY: "Do you have any history of lung disease?"  (e.g., pulmonary embolus, asthma, emphysema)     History PE  8. CAUSE: "What do you think is causing the breathing problem?"      Weight gain, was 130 now 205lbs  9. OTHER SYMPTOMS: "Do you have any other symptoms? (e.g., dizziness, runny nose, cough, chest pain, fever)     No other symptoms  10. O2 SATURATION MONITOR:  "Do you use an oxygen saturation monitor (pulse oximeter) at home?" If Yes, ask: "What is your reading (oxygen level) today?" "What is your usual oxygen saturation reading?" (e.g., 95%)       No  11. PREGNANCY: "Is there any chance you are pregnant?" "When was your last menstrual period?"       No; partial hysterectomy  12. TRAVEL: "Have you traveled out  of the country in the last month?" (e.g., travel history, exposures)       No  Protocols used: Breathing Difficulty-A-AH

## 2023-03-08 ENCOUNTER — Ambulatory Visit: Payer: Medicare HMO | Admitting: Family Medicine

## 2023-03-08 ENCOUNTER — Encounter: Payer: Self-pay | Admitting: Family Medicine

## 2023-03-08 VITALS — BP 128/82 | HR 76 | Temp 98.6°F | Ht 61.0 in | Wt 203.0 lb

## 2023-03-08 DIAGNOSIS — Z1159 Encounter for screening for other viral diseases: Secondary | ICD-10-CM

## 2023-03-08 DIAGNOSIS — S88111A Complete traumatic amputation at level between knee and ankle, right lower leg, initial encounter: Secondary | ICD-10-CM | POA: Insufficient documentation

## 2023-03-08 DIAGNOSIS — Z23 Encounter for immunization: Secondary | ICD-10-CM

## 2023-03-08 DIAGNOSIS — I10 Essential (primary) hypertension: Secondary | ICD-10-CM

## 2023-03-08 DIAGNOSIS — Z1329 Encounter for screening for other suspected endocrine disorder: Secondary | ICD-10-CM

## 2023-03-08 DIAGNOSIS — M7989 Other specified soft tissue disorders: Secondary | ICD-10-CM

## 2023-03-08 DIAGNOSIS — R638 Other symptoms and signs concerning food and fluid intake: Secondary | ICD-10-CM

## 2023-03-08 DIAGNOSIS — E119 Type 2 diabetes mellitus without complications: Secondary | ICD-10-CM

## 2023-03-08 DIAGNOSIS — Z1231 Encounter for screening mammogram for malignant neoplasm of breast: Secondary | ICD-10-CM

## 2023-03-08 DIAGNOSIS — Z13220 Encounter for screening for lipoid disorders: Secondary | ICD-10-CM | POA: Diagnosis not present

## 2023-03-08 DIAGNOSIS — E782 Mixed hyperlipidemia: Secondary | ICD-10-CM

## 2023-03-08 DIAGNOSIS — Z6838 Body mass index (BMI) 38.0-38.9, adult: Secondary | ICD-10-CM

## 2023-03-08 DIAGNOSIS — E538 Deficiency of other specified B group vitamins: Secondary | ICD-10-CM

## 2023-03-08 NOTE — Assessment & Plan Note (Signed)
Well controlled on current regimen. Continue hydrochlorothiazide 25mg  daily, Metop Succ 150mg  daily, Spironolactone 100mg  daily, and Telmisartan 80mg  daily. Recent salt craving that she seems to have gotten control of. Fasting labs today to include BNP. Recommend heart healthy diet such as Mediterranean diet with whole grains, fruits, vegetable, fish, lean meats, nuts, and olive oil. Limit salt. Encouraged moderate walking, 3-5 times/week for 30-50 minutes each session. Aim for at least 150 minutes.week. Goal should be pace of 3 miles/hours, or walking 1.5 miles in 30 minutes. Avoid tobacco products. Avoid excess alcohol. Take medications as prescribed and bring medications and blood pressure log with cuff to each office visit. Seek medical care for chest pain, palpitations, shortness of breath with exertion, dizziness/lightheadedness, vision changes, recurrent headaches, or swelling of extremities. Follow up in 1 week to discuss labs

## 2023-03-08 NOTE — Assessment & Plan Note (Signed)
Associated with DM2, HTN, and HLD. Counseled on importance of weight management for overall health. Encouraged low calorie, heart healthy diet and moderate intensity exercise 150 minutes weekly. This is 3-5 times weekly for 30-50 minutes each session. Goal should be pace of 3 miles/hours, or walking 1.5 miles in 30 minutes and include strength training.

## 2023-03-08 NOTE — Assessment & Plan Note (Addendum)
Confounded by obesity. Will work on getting GLP covered. Fasting labs today. Continue Crestor 40mg  daily.
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
## 2023-03-08 NOTE — Assessment & Plan Note (Signed)
Cortisol level drawn as well as CMP. She has reduced her salt intake and her husband is also closely monitoring. SOB and LE swelling improved.Have also drawn D-dimer and BNP

## 2023-03-08 NOTE — Assessment & Plan Note (Signed)
D-dimer and BNP today. No SOB or tachycardia on exam today. Recent CTA negative her patient. Will discuss starting Xarelto as next visit as she is currently not anticoagulated with hx of PE

## 2023-03-08 NOTE — Assessment & Plan Note (Addendum)
Continue Glipidize 10mg  dialy. Confounded by obesity. Will work on getting GLP covered if indicated. A1c and uACR done today. Foot exam due. Vaccines UTD. Retinal eye exam scheduled. Recommend heart healthy diet such as Mediterranean diet with whole grains, fruits, vegetable, fish, lean meats, nuts, and olive oil. Limit salt. Encouraged moderate walking, 3-5 times/week for 30-50 minutes each session. Aim for at least 150 minutes.week. Goal should be pace of 3 miles/hours, or walking 1.5 miles in 30 minutes. Seek medical care for urinary frequency, extreme thirst, vision changes, lightheadedness, dizziness.  Follow up in 1 week for discussion of labs

## 2023-03-08 NOTE — Patient Instructions (Signed)
It was great to meet you today and I'm excited to have you join the [facility] practice. I hope you had a positive experience today! If you feel so inclined, please feel free to recommend our practice to friends and family. Kurtis Bushman, FNP-C

## 2023-03-09 LAB — CBC WITH DIFFERENTIAL/PLATELET
Absolute Lymphocytes: 2366 {cells}/uL (ref 850–3900)
Absolute Monocytes: 817 {cells}/uL (ref 200–950)
Basophils Absolute: 29 {cells}/uL (ref 0–200)
Basophils Relative: 0.3 %
Eosinophils Absolute: 304 {cells}/uL (ref 15–500)
Eosinophils Relative: 3.2 %
HCT: 39.8 % (ref 35.0–45.0)
Hemoglobin: 13 g/dL (ref 11.7–15.5)
MCH: 30.4 pg (ref 27.0–33.0)
MCHC: 32.7 g/dL (ref 32.0–36.0)
MCV: 93.2 fL (ref 80.0–100.0)
MPV: 9.6 fL (ref 7.5–12.5)
Monocytes Relative: 8.6 %
Neutro Abs: 5985 {cells}/uL (ref 1500–7800)
Neutrophils Relative %: 63 %
Platelets: 378 10*3/uL (ref 140–400)
RBC: 4.27 10*6/uL (ref 3.80–5.10)
RDW: 13.1 % (ref 11.0–15.0)
Total Lymphocyte: 24.9 %
WBC: 9.5 10*3/uL (ref 3.8–10.8)

## 2023-03-09 LAB — COMPLETE METABOLIC PANEL WITH GFR
AG Ratio: 1.5 (calc) (ref 1.0–2.5)
ALT: 32 U/L — ABNORMAL HIGH (ref 6–29)
AST: 28 U/L (ref 10–35)
Albumin: 4.3 g/dL (ref 3.6–5.1)
Alkaline phosphatase (APISO): 90 U/L (ref 37–153)
BUN: 20 mg/dL (ref 7–25)
CO2: 25 mmol/L (ref 20–32)
Calcium: 9.9 mg/dL (ref 8.6–10.4)
Chloride: 100 mmol/L (ref 98–110)
Creat: 0.79 mg/dL (ref 0.50–1.05)
Globulin: 2.8 g/dL (ref 1.9–3.7)
Glucose, Bld: 180 mg/dL — ABNORMAL HIGH (ref 65–99)
Potassium: 4.3 mmol/L (ref 3.5–5.3)
Sodium: 137 mmol/L (ref 135–146)
Total Bilirubin: 0.4 mg/dL (ref 0.2–1.2)
Total Protein: 7.1 g/dL (ref 6.1–8.1)
eGFR: 85 mL/min/{1.73_m2} (ref >=60)

## 2023-03-09 LAB — THYROID PANEL WITH TSH
Free Thyroxine Index: 2.3 (ref 1.4–3.8)
T3 Uptake: 26 % (ref 22–35)
T4, Total: 8.8 ug/dL (ref 5.1–11.9)
TSH: 2.5 m[IU]/L (ref 0.40–4.50)

## 2023-03-09 LAB — HEMOGLOBIN A1C
Hgb A1c MFr Bld: 9 %{Hb} — ABNORMAL HIGH (ref <5.7)
Mean Plasma Glucose: 212 mg/dL
eAG (mmol/L): 11.7 mmol/L

## 2023-03-09 LAB — MICROALBUMIN / CREATININE URINE RATIO
Creatinine, Urine: 76 mg/dL (ref 20–275)
Microalb Creat Ratio: 9 mg/g{creat} (ref <30)
Microalb, Ur: 0.7 mg/dL

## 2023-03-09 LAB — LIPID PANEL
Cholesterol: 231 mg/dL — ABNORMAL HIGH (ref <200)
HDL: 54 mg/dL (ref >=50)
LDL Cholesterol (Calc): 123 mg/dL — ABNORMAL HIGH
Non-HDL Cholesterol (Calc): 177 mg/dL — ABNORMAL HIGH (ref <130)
Total CHOL/HDL Ratio: 4.3 (calc) (ref <5.0)
Triglycerides: 378 mg/dL — ABNORMAL HIGH (ref <150)

## 2023-03-09 LAB — CORTISOL: Cortisol, Plasma: 13.3 ug/dL

## 2023-03-09 LAB — HEPATITIS C ANTIBODY: Hepatitis C Ab: NONREACTIVE

## 2023-03-09 LAB — D-DIMER, QUANTITATIVE: D-Dimer, Quant: 0.67 ug{FEU}/mL — ABNORMAL HIGH (ref <0.50)

## 2023-03-10 ENCOUNTER — Telehealth: Payer: Self-pay

## 2023-03-10 ENCOUNTER — Other Ambulatory Visit: Payer: Self-pay | Admitting: Family Medicine

## 2023-03-10 ENCOUNTER — Encounter: Payer: Self-pay | Admitting: Family Medicine

## 2023-03-10 DIAGNOSIS — M7989 Other specified soft tissue disorders: Secondary | ICD-10-CM

## 2023-03-10 DIAGNOSIS — R7989 Other specified abnormal findings of blood chemistry: Secondary | ICD-10-CM

## 2023-03-10 DIAGNOSIS — R0602 Shortness of breath: Secondary | ICD-10-CM

## 2023-03-10 NOTE — Telephone Encounter (Signed)
Ref orders fax to [facility] per FNP to ruled out DVT   Fax# [phone]

## 2023-03-10 NOTE — Progress Notes (Signed)
D-dimer 0.67 with recent history of shortness of breath and left lower extremity swelling. She has personal history of PE and is off anticoagulation. [facility] Criteria 16% probability. Plan to start Xarelto.

## 2023-03-11 ENCOUNTER — Encounter: Payer: Self-pay | Admitting: Family Medicine

## 2023-03-11 ENCOUNTER — Telehealth: Payer: Self-pay

## 2023-03-11 ENCOUNTER — Other Ambulatory Visit: Payer: Self-pay

## 2023-03-11 ENCOUNTER — Telehealth (HOSPITAL_COMMUNITY): Payer: Self-pay

## 2023-03-11 NOTE — Telephone Encounter (Signed)
FYI  Spoke w/[facility] tried calling pt in today to do a STAT DVT, pt stated that she is unable to go due to transportation. Para March was trying to get pt help w/transportation, however pt still can't come, per pt "have a migrane."

## 2023-03-11 NOTE — Telephone Encounter (Signed)
Spoke w/pt regarding her STAT DVT/CT Angio procedure today. Per pt" I can't do it today due to my Migranes. I understand how important the procedure is. But when I have a migrane, I can't do anything all day but lay down."  Per pt agrees to try and make appts for Monday.

## 2023-03-11 NOTE — Addendum Note (Signed)
Addended by: Arta Silence on: 03/11/2023 12:03 PM   Modules accepted: Orders

## 2023-03-11 NOTE — Addendum Note (Signed)
Addended by: Arta Silence on: 03/11/2023 11:49 AM   Modules accepted: Orders

## 2023-03-11 NOTE — Telephone Encounter (Signed)
Received call from office regarding ultrasound DVT study.  Office stated they needed stat study for patient.  After some communication we received the order. Called patient to attempt to schedule, patient did not have a ride Communicated with ordering office, social work, and leadership to attempt to obtain transportation for patient. - transportation was approved. Called patient to inform that we could supply transportation, patient declined stating migraine as the reason, and that she would not be able to make an appointment today I contacted the ordering office again, they approved delay in patient's study based on the information above.  I called and scheduled the appointment with the patient 7:30 am Monday. Patient called back left message, canceling the appointment for Monday due to other appointment being changed. I returned the call to the patient asking if the patient wanted to keep this appointment and offered to change the appointment, patient declined and asked for the appointment to be canceled. Patient stated that they would be returning to their PCP on Tuesday and having "some vein thing" done on Wednesday.  Informed contact at office Blia.

## 2023-03-14 ENCOUNTER — Ambulatory Visit (HOSPITAL_COMMUNITY): Payer: Medicare HMO

## 2023-03-14 ENCOUNTER — Telehealth: Payer: Self-pay

## 2023-03-14 ENCOUNTER — Encounter (HOSPITAL_COMMUNITY): Payer: Self-pay

## 2023-03-14 NOTE — Telephone Encounter (Signed)
Copied from CRM [id number]. Topic: General - Other >> Mar 14, 2023  1:08 PM Geneva B wrote: Reason for CRM: patient wants to know what labs are the most important for her to get done because her insurance is only going to p[ay for one please call pt back [phone]

## 2023-03-15 ENCOUNTER — Ambulatory Visit (INDEPENDENT_AMBULATORY_CARE_PROVIDER_SITE_OTHER): Payer: Medicare HMO | Admitting: Family Medicine

## 2023-03-15 ENCOUNTER — Other Ambulatory Visit: Payer: Self-pay | Admitting: Family Medicine

## 2023-03-15 ENCOUNTER — Other Ambulatory Visit: Payer: Self-pay

## 2023-03-15 ENCOUNTER — Encounter: Payer: Self-pay | Admitting: Family Medicine

## 2023-03-15 VITALS — BP 128/90 | HR 97 | Temp 97.7°F | Ht 61.0 in | Wt 202.0 lb

## 2023-03-15 DIAGNOSIS — I872 Venous insufficiency (chronic) (peripheral): Secondary | ICD-10-CM | POA: Diagnosis not present

## 2023-03-15 DIAGNOSIS — I83892 Varicose veins of left lower extremities with other complications: Secondary | ICD-10-CM | POA: Diagnosis not present

## 2023-03-15 DIAGNOSIS — E1165 Type 2 diabetes mellitus with hyperglycemia: Secondary | ICD-10-CM | POA: Insufficient documentation

## 2023-03-15 DIAGNOSIS — Z86711 Personal history of pulmonary embolism: Secondary | ICD-10-CM | POA: Diagnosis not present

## 2023-03-15 DIAGNOSIS — N3000 Acute cystitis without hematuria: Secondary | ICD-10-CM

## 2023-03-15 DIAGNOSIS — R0602 Shortness of breath: Secondary | ICD-10-CM | POA: Diagnosis not present

## 2023-03-15 DIAGNOSIS — R350 Frequency of micturition: Secondary | ICD-10-CM

## 2023-03-15 DIAGNOSIS — E782 Mixed hyperlipidemia: Secondary | ICD-10-CM

## 2023-03-15 DIAGNOSIS — Z7984 Long term (current) use of oral hypoglycemic drugs: Secondary | ICD-10-CM | POA: Diagnosis not present

## 2023-03-15 DIAGNOSIS — I890 Lymphedema, not elsewhere classified: Secondary | ICD-10-CM | POA: Diagnosis not present

## 2023-03-15 DIAGNOSIS — R252 Cramp and spasm: Secondary | ICD-10-CM | POA: Diagnosis not present

## 2023-03-15 LAB — BRAIN NATRIURETIC PEPTIDE: Brain Natriuretic Peptide: 8 pg/mL (ref <100)

## 2023-03-15 LAB — URINALYSIS, ROUTINE W REFLEX MICROSCOPIC
Bilirubin Urine: NEGATIVE
Hgb urine dipstick: NEGATIVE
Hyaline Cast: NONE SEEN /[LPF]
Ketones, ur: NEGATIVE
Nitrite: NEGATIVE
Protein, ur: NEGATIVE
RBC / HPF: NONE SEEN /[HPF] (ref 0–2)
Specific Gravity, Urine: 1.015 (ref 1.001–1.035)
pH: 7 (ref 5.0–8.0)

## 2023-03-15 LAB — MICROSCOPIC MESSAGE

## 2023-03-15 MED ORDER — GABAPENTIN 100 MG PO CAPS: 100.0000 mg | ORAL_CAPSULE | Freq: Two times a day (BID) | ORAL | 3 refills | Status: DC

## 2023-03-15 MED ORDER — SEMAGLUTIDE(0.25 OR 0.5MG/DOS) 2 MG/3ML ~~LOC~~ SOPN: PEN_INJECTOR | SUBCUTANEOUS | 1 refills | Status: AC

## 2023-03-15 MED ORDER — EZETIMIBE 10 MG PO TABS: 10.0000 mg | ORAL_TABLET | Freq: Every day | ORAL | 3 refills | Status: AC

## 2023-03-15 MED ORDER — CEPHALEXIN 500 MG PO CAPS: 500.0000 mg | ORAL_CAPSULE | Freq: Two times a day (BID) | ORAL | 0 refills | Status: DC

## 2023-03-15 MED ORDER — LANCET DEVICE MISC: 1.0000 | Freq: Three times a day (TID) | 0 refills | Status: AC

## 2023-03-15 MED ORDER — TIZANIDINE HCL 4 MG PO TABS: 4.0000 mg | ORAL_TABLET | Freq: Every evening | ORAL | 2 refills | Status: DC

## 2023-03-15 MED ORDER — BLOOD GLUCOSE MONITORING SUPPL DEVI: 1.0000 | Freq: Three times a day (TID) | 0 refills | Status: DC

## 2023-03-15 MED ORDER — RIVAROXABAN 20 MG PO TABS: 20.0000 mg | ORAL_TABLET | Freq: Every day | ORAL | 3 refills | Status: DC

## 2023-03-15 MED ORDER — BLOOD GLUCOSE TEST VI STRP: 1.0000 | ORAL_STRIP | Freq: Three times a day (TID) | 0 refills | Status: AC

## 2023-03-15 MED ORDER — LANCETS MISC. MISC: 1.0000 | Freq: Three times a day (TID) | 0 refills | Status: AC

## 2023-03-15 NOTE — Assessment & Plan Note (Signed)
Not currently on anticoagulation. She refuses CT in setting of SOB due to cost. DVT study today. Eliquis cost prohibitive and warfarin she was noncompliant with INR. Start Xarelto 20mg  daily.

## 2023-03-15 NOTE — Assessment & Plan Note (Signed)
 LDL not at goal <70. Contineu Rosuvastatin  40mg  daily and start Zetia  10mg  daily. Recheck lipids and CMP in 4 weeks. I recommend consuming a heart healthy diet such as Mediterranean diet or DASH diet with whole grains, fruits, vegetable, fish, lean meats, nuts, and olive oil. Limit sweets and processed foods. I also encourage moderate intensity exercise 150 minutes weekly. This is 3-5 times weekly for 30-50 minutes each session. Goal should be pace of 3 miles/hours, or walking 1.5 miles in 30 minutes. The 10-year ASCVD risk score (Arnett DK, et al., 2019) is: 10.4%

## 2023-03-15 NOTE — Assessment & Plan Note (Signed)
 A1c 9.0%. Not monitoring. Ordered glucose monitoring kit. Continue Glipizide. Start Ozempic  0.25mg  weekly and increase as tolerated. Encouraged to monitor BG fasting in AM and again in PM. A1c and uACR done. Foot exam needed. Vaccines need PNA. Retinal eye exam scheduled. Recommend heart healthy diet such as Mediterranean diet with whole grains, fruits, vegetable, fish, lean meats, nuts, and olive oil. Limit salt. Encouraged moderate walking, 3-5 times/week for 30-50 minutes each session. Aim for at least 150 minutes.week. Goal should be pace of 3 miles/hours, or walking 1.5 miles in 30 minutes. Seek medical care for urinary frequency, extreme thirst, vision changes, lightheadedness, dizziness.  Follow up in 4 weeks.
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
## 2023-03-15 NOTE — Telephone Encounter (Signed)
 Copied from CRM [id number]. Topic: General - Other >> Mar 15, 2023  1:39 PM Elle L wrote: Reason for CRM: Dr. Consuela Mimes at [facility] is requesting a call back at [phone] to speak to FNP Kurtis Bushman regarding the patient.

## 2023-03-15 NOTE — Assessment & Plan Note (Addendum)
D-dimer 0.67. EKG NSR. refused Ct. DVT study today due to LLE swelling. Start xarelto due to history of PE currently not on anticoagulation. BNP drawn today. No wheezing or SOB today.

## 2023-03-15 NOTE — Progress Notes (Signed)
Spoke with Dr Ardyth Gal at center for vein restoration, no LLE DVT. Recommended CT iliofemoral, he will discuss with patient and proceed with ordering if she consents. Other findings include mild lipedema and lymphedema, compression stocking was provided.

## 2023-03-15 NOTE — Assessment & Plan Note (Signed)
Urine dipstick shows positive for glucose and positive for leukocytes.  Micro exam: 6-10 WBC's per HPF, few+ bacteria, and contaminated specimen. Start Keflex 500mg  BID x7d.

## 2023-03-16 ENCOUNTER — Telehealth: Payer: Self-pay

## 2023-03-16 NOTE — Progress Notes (Signed)
 Care Guide Pharmacy Note  03/16/2023 Name: [name] MRN: [id number] DOB: [birth year]  Referred By: Kayla Jeoffrey RAMAN, FNP Reason for referral: Care Coordination (Outreach to schedule with Pharm d )   [name] is a 63 y.o. year old female who is a primary care patient of Kayla Jeoffrey RAMAN, FNP.  [name] was referred to the pharmacist for assistance related to: DMII  An unsuccessful telephone outreach was attempted today to contact the patient who was referred to the pharmacy team for assistance with medication assistance. Additional attempts will be made to contact the patient.  Jeoffrey [place] , RMA     [facility]  [facility], [facility] Guide  Direct Dial: [phone]  Website: [email or web address]

## 2023-03-16 NOTE — Telephone Encounter (Signed)
 Copied from CRM [id number]. Topic: General - Other >> Mar 16, 2023 10:27 AM Carlatta H wrote: Reason for CRM: Patient received a call from [facility]//Please call back

## 2023-03-16 NOTE — Telephone Encounter (Signed)
 Copied from CRM [id number]. Topic: Clinical - Prescription Issue >> Mar 15, 2023  3:56 PM Alfonso ORN wrote: Reason for CRM: Patient was prescribe a shot for diabetes to help bring her sugar down  today at patient visit with Jeoffrey Barrio Patient received a call from the pharmacy that  the insurance will not cover the medication  , too expensive

## 2023-03-17 ENCOUNTER — Ambulatory Visit: Payer: Medicare HMO

## 2023-03-17 ENCOUNTER — Other Ambulatory Visit: Payer: Self-pay | Admitting: Family Medicine

## 2023-03-17 MED ORDER — METFORMIN HCL ER 500 MG PO TB24: 500.0000 mg | ORAL_TABLET | Freq: Every day | ORAL | 1 refills | Status: DC

## 2023-03-22 ENCOUNTER — Telehealth: Payer: Self-pay

## 2023-03-22 NOTE — Telephone Encounter (Signed)
Spoke w/pt regarding pt's liner's for R-amputee stump.  Pt aware Rx sent to [facility] today.

## 2023-03-22 NOTE — Telephone Encounter (Signed)
Copied from CRM [id number]. Topic: Clinical - Medical Advice >> Mar 22, 2023  8:07 AM Gildardo Pounds wrote: Reason for CRM: Patient needs Amber to send a prescription for the liners patient uses for her prosthetic to pharmacy. Callback number is [phone]

## 2023-03-22 NOTE — Telephone Encounter (Signed)
Rx for amputee stump liners has been fax to [facility] per pt request

## 2023-03-23 ENCOUNTER — Ambulatory Visit: Payer: Medicare HMO

## 2023-03-23 NOTE — Progress Notes (Signed)
Care Guide Pharmacy Note  03/23/2023 Name: ROBERTO HLAVATY MRN: [id number] DOB: 10-06-1960  Referred By: Park Meo, FNP Reason for referral: Care Coordination (Outreach to schedule with Pharm d )   [name] is a 63 y.o. year old female who is a primary care patient of Park Meo, FNP.  [name] was referred to the pharmacist for assistance related to: DMII  Successful contact was made with the patient to discuss pharmacy services including being ready for the pharmacist to call at least 5 minutes before the scheduled appointment time and to have medication bottles and any blood pressure readings ready for review. The patient agreed to meet with the pharmacist via telephone visit on (date/time).04/27/2023  Penne Lash , RMA     [place]  [facility], [facility] Guide  Direct Dial: [phone]  Website: [email or web address]

## 2023-03-28 ENCOUNTER — Telehealth: Payer: Self-pay

## 2023-03-28 DIAGNOSIS — R350 Frequency of micturition: Secondary | ICD-10-CM

## 2023-03-28 NOTE — Telephone Encounter (Signed)
 Copied from CRM [id number]. Topic: Referral - Question >> Mar 28, 2023  9:51 AM Ivette P wrote: Reason for CRM: Melissa called in to follow up on a referral for Liners and Prosthetic Supplies. [facility] office does not recognize signature and would like to confirm. Call back [phone]

## 2023-03-29 NOTE — Telephone Encounter (Signed)
 Called and spoke w/[name] w/[facility]. Print chart notes from last visit, 03/15/23 and put up front to be fax. [name]. Fax [phone]

## 2023-03-31 ENCOUNTER — Ambulatory Visit: Payer: Medicare HMO

## 2023-03-31 ENCOUNTER — Other Ambulatory Visit: Payer: Self-pay | Admitting: Family Medicine

## 2023-03-31 NOTE — Addendum Note (Signed)
 Addended by: Arta Silence on: 03/31/2023 11:33 AM   Modules accepted: Orders

## 2023-03-31 NOTE — Telephone Encounter (Signed)
 Called and spoke w/pt this morning. Pt coming in tom. After for UA/culture lab work after 2pm per NP

## 2023-03-31 NOTE — Telephone Encounter (Unsigned)
 Copied from CRM [id number]. Topic: Clinical - Medical Advice >> Mar 31, 2023  8:06 AM Gildardo Pounds wrote: Reason for CRM: Patient states that she was advised during appointment on 03/15/2023 that if the cephALEXin (KEFLEX) 500 MG capsule does not work to call the office and advise Amber so that another medication can be called in for the infection. Callback number is [phone]

## 2023-04-01 ENCOUNTER — Other Ambulatory Visit: Payer: Medicare HMO

## 2023-04-05 ENCOUNTER — Other Ambulatory Visit: Payer: Self-pay | Admitting: Family Medicine

## 2023-04-05 DIAGNOSIS — E119 Type 2 diabetes mellitus without complications: Secondary | ICD-10-CM

## 2023-04-07 ENCOUNTER — Other Ambulatory Visit: Payer: Medicare HMO

## 2023-04-07 ENCOUNTER — Ambulatory Visit: Payer: Medicare HMO

## 2023-04-07 DIAGNOSIS — R350 Frequency of micturition: Secondary | ICD-10-CM | POA: Diagnosis not present

## 2023-04-08 LAB — URINALYSIS, ROUTINE W REFLEX MICROSCOPIC
Bilirubin Urine: NEGATIVE
Hgb urine dipstick: NEGATIVE
Hyaline Cast: NONE SEEN /LPF
Ketones, ur: NEGATIVE
Nitrite: NEGATIVE
Protein, ur: NEGATIVE
RBC / HPF: NONE SEEN /HPF (ref 0–2)
Specific Gravity, Urine: 1.019 (ref 1.001–1.035)
pH: 6.5 (ref 5.0–8.0)

## 2023-04-08 LAB — MICROSCOPIC MESSAGE

## 2023-04-09 LAB — URINE CULTURE
MICRO NUMBER:: 16142048
SPECIMEN QUALITY:: ADEQUATE

## 2023-04-11 NOTE — Telephone Encounter (Signed)
 Copied from CRM [id number]. Topic: Clinical - Prescription Issue >> Apr 11, 2023 11:48 AM Ivette P wrote: Reason for CRM: Melissa - called in to follow up on Faxed over Standard written form for a prosthetic for pt.   Pt is to return tomorrow 03/04 [facility] is needing form filled out prior appt tomorrow at 3:30 PM

## 2023-04-12 ENCOUNTER — Ambulatory Visit: Admitting: Family Medicine

## 2023-04-13 ENCOUNTER — Ambulatory Visit: Payer: Self-pay | Admitting: Family Medicine

## 2023-04-13 NOTE — Telephone Encounter (Signed)
 This RN made second attempt to reach patient. The recording states, "Your call could not be completed as dialed."

## 2023-04-13 NOTE — Telephone Encounter (Signed)
 1st attempt to contact patient-unable to leave message "call cannot be completed as dialed."  Copied from CRM [id number]. Topic: Clinical - Medical Advice >> Apr 13, 2023  1:41 PM Marland Kitchen D wrote: Reason for CRM: Patient states she has loss her taste for food and isn't able to enjoy what she eats. She wants to know if this is serious.

## 2023-04-13 NOTE — Telephone Encounter (Signed)
 This RN made a third attempt to get in contact with patient. Recording of "Call cannot be completed as dialed" after two rings. Will route to office.

## 2023-04-19 ENCOUNTER — Ambulatory Visit: Payer: Medicare HMO | Admitting: Family Medicine

## 2023-04-20 ENCOUNTER — Ambulatory Visit: Admitting: Family Medicine

## 2023-04-20 ENCOUNTER — Encounter: Payer: Self-pay | Admitting: Family Medicine

## 2023-04-20 VITALS — BP 132/80 | HR 105 | Temp 97.6°F | Ht 61.0 in | Wt 200.8 lb

## 2023-04-20 DIAGNOSIS — E782 Mixed hyperlipidemia: Secondary | ICD-10-CM | POA: Diagnosis not present

## 2023-04-20 DIAGNOSIS — Z23 Encounter for immunization: Secondary | ICD-10-CM | POA: Diagnosis not present

## 2023-04-20 DIAGNOSIS — E1165 Type 2 diabetes mellitus with hyperglycemia: Secondary | ICD-10-CM | POA: Diagnosis not present

## 2023-04-20 DIAGNOSIS — Z86711 Personal history of pulmonary embolism: Secondary | ICD-10-CM | POA: Diagnosis not present

## 2023-04-20 DIAGNOSIS — Z7984 Long term (current) use of oral hypoglycemic drugs: Secondary | ICD-10-CM

## 2023-04-20 DIAGNOSIS — M7989 Other specified soft tissue disorders: Secondary | ICD-10-CM | POA: Diagnosis not present

## 2023-04-20 NOTE — Assessment & Plan Note (Signed)
 A1c 9.0%. Not monitoring due to backorder of supplies. Continue Glipizide. Failed Metformin due to GI upset. Need approval for Ozempic 0.25mg  weekly and increase as tolerated. Encouraged to monitor BG fasting in AM and again in PM. A1c and uACR done. Foot exam done. Vaccines PNA today. Retinal eye exam scheduled. Recommend heart healthy diet such as Mediterranean diet with whole grains, fruits, vegetable, fish, lean meats, nuts, and olive oil. Limit salt. Encouraged moderate walking, 3-5 times/week for 30-50 minutes each session. Aim for at least 150 minutes.week. Goal should be pace of 3 miles/hours, or walking 1.5 miles in 30 minutes. Seek medical care for urinary frequency, extreme thirst, vision changes, lightheadedness, dizziness.  Follow up in 6 weeks and keep appt with pharmacy next week.
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
## 2023-04-20 NOTE — Assessment & Plan Note (Signed)
 LDL not at goal <70. Contineu Rosuvastatin 40mg  daily and start Zetia 10mg  daily (has not started due to did not receive from pharmacy). Recheck lipids and CMP in 6 weeks. I recommend consuming a heart healthy diet such as Mediterranean diet or DASH diet with whole grains, fruits, vegetable, fish, lean meats, nuts, and olive oil. Limit sweets and processed foods. I also encourage moderate intensity exercise 150 minutes weekly. This is 3-5 times weekly for 30-50 minutes each session. Goal should be pace of 3 miles/hours, or walking 1.5 miles in 30 minutes. The 10-year ASCVD risk score (Arnett DK, et al., 2019) is: 11%

## 2023-04-20 NOTE — Assessment & Plan Note (Signed)
 Resolved. Vascular studies negative. History of PE working on getting xarelto restarted.

## 2023-04-20 NOTE — Assessment & Plan Note (Signed)
 Not currently on anticoagulation. She refuses CT in setting of SOB due to cost. DVT study negative. Eliquis cost prohibitive and warfarin she was noncompliant with INR. Has not received xarelto, will see pharmacy next week and is due for a new pill pack. Asked to notify me when she gets this to relay what medications are included. Letter sent to pharmacist regarding missing medications.

## 2023-04-26 ENCOUNTER — Ambulatory Visit: Admitting: Family Medicine

## 2023-04-27 ENCOUNTER — Encounter: Payer: Self-pay | Admitting: Pharmacist

## 2023-04-27 ENCOUNTER — Other Ambulatory Visit: Payer: Self-pay | Admitting: Pharmacist

## 2023-04-27 ENCOUNTER — Telehealth: Payer: Self-pay | Admitting: Pharmacist

## 2023-04-27 NOTE — Telephone Encounter (Signed)
 Patient to complete Novo PAP online (ozempic 0.5mg ); send me provider portion online? Or to PCP fax Whichever is easier Thank you!

## 2023-04-27 NOTE — Progress Notes (Signed)
 04/27/2023 Name: [name] MRN: [id number] DOB: 03/11/1960  Chief Complaint  Patient presents with   Diabetes    [name] is a 63 y.o. year old female who presented for a telephone visit.   They were referred to the pharmacist by their PCP for assistance in managing diabetes and medication access.    Subjective:  Care Team: Primary Care Provider: Park Meo, FNP    Medication Access/Adherence  Current Pharmacy:  [facility] [place], [place] - [address] [place] [address] Phone: [phone] Fax: [phone]  Patient reports affordability concerns with their medications: No  Patient reports access/transportation concerns to their pharmacy: No  Patient reports adherence concerns with their medications:  No    Diabetes:  Current medications: glipizide Medications tried in the past: metformin, Mounjaro (can't get any more due to medicare)  Current glucose readings: A1c 9.0% Using accuchek guide meter; testing daily  Patient denies hypoglycemic s/sx including dizziness, shakiness, sweating. Patient denies hyperglycemic symptoms including polyuria, polydipsia, polyphagia, nocturia, neuropathy, blurred vision.  Current meal patterns:  Discussed meal planning options and Plate method for healthy eating Avoid sugary drinks and desserts Incorporate balanced protein, non starchy veggies, 1 serving of carbohydrate with each meal Increase water intake Increase physical activity as able  Current physical activity: encouraged as able ; reports chair yoga (patient has prosthetic); walks dog  Current medication access support: [employer]   Objective:  Lab Results  Component Value Date   HGBA1C 9.0 (H) 03/08/2023    Lab Results  Component Value Date   CREATININE 0.79 03/08/2023   BUN 20 03/08/2023   NA 137 03/08/2023   K 4.3 03/08/2023   CL 100 03/08/2023   CO2 25 03/08/2023    Lab Results  Component Value  Date   CHOL 231 (H) 03/08/2023   HDL 54 03/08/2023   LDLCALC 123 (H) 03/08/2023   LDLDIRECT 233.1 (H) 08/02/2021   TRIG 378 (H) 03/08/2023   CHOLHDL 4.3 03/08/2023    Medications Reviewed Today     Reviewed by Danella Maiers, [facility] (Pharmacist) on 04/27/23 at 1109  Med List Status: <None>   Medication Order Taking? Sig Documenting Provider Last Dose Status Informant  Blood Glucose Monitoring Suppl DEVI [id number]  1 each by Does not apply route in the morning, at noon, and at bedtime. May substitute to any manufacturer covered by patient's insurance. Park Meo, FNP  Active   cephALEXin (KEFLEX) 500 MG capsule [id number]  Take 1 capsule (500 mg total) by mouth 2 (two) times daily. Park Meo, FNP  Active   ezetimibe (ZETIA) 10 MG tablet [id number]  Take 1 tablet (10 mg total) by mouth daily. Park Meo, FNP  Active   gabapentin (NEURONTIN) 100 MG capsule [id number]  Take 1 capsule (100 mg total) by mouth 2 (two) times daily. Kurtis Bushman S, FNP  Active   glipiZIDE (GLUCOTROL XL) 10 MG 24 hr tablet [id number]  TAKE 1 TABLET BY MOUTH ONCE DAILY FOR DIABETES Park Meo, FNP  Active   hydrochlorothiazide (HYDRODIURIL) 25 MG tablet [id number]  TAKE 1 TABLET BY MOUTH EVERY DAY FOR BLOOD PRESSURE Park Meo, FNP  Active   metoprolol succinate (TOPROL-XL) 100 MG 24 hr tablet [id number]  TAKE 1 AND 1/2 TABLETS BY MOUTH EVERY DAY FOR BLOOD PRESSURE Park Meo, FNP  Active   omeprazole (PRILOSEC) 40 MG capsule [id number]  TAKE 1 CAPSULE BY MOUTH 2 TIMES  DAILY Park Meo, FNP  Active   rivaroxaban (XARELTO) 20 MG TABS tablet [id number]  Take 1 tablet (20 mg total) by mouth daily with supper. Please give 15 mg tabs QS for use BID x 21 days, then 20 mg PO daily. Park Meo, FNP  Active   rosuvastatin (CRESTOR) 40 MG tablet [id number]  TAKE 1 TABLET BY MOUTH EVERY DAY FOR CHOLESTEROL Park Meo, FNP  Active   Semaglutide,0.25 or 0.5MG /DOS, 2 MG/3ML [name] [id number] No Inject  0.25 mg into the skin once a week for 28 days, THEN 0.5 mg once a week for 28 days.  Patient not taking: Reported on 04/27/2023   Park Meo, FNP Not Taking Active   spironolactone (ALDACTONE) 100 MG tablet [id number]  TAKE 1 TABLET BY MOUTH EVERY DAY FOR BLOOD PRESSURE, LEG SWELLING, AND HAIR LOSS Park Meo, FNP  Active   telmisartan (MICARDIS) 80 MG tablet [id number]  TAKE 1 TABLET BY MOUTH EVERY DAY FOR BLOOD PRESSURE Park Meo, FNP  Active   tiZANidine (ZANAFLEX) 4 MG tablet [id number]  Take 1 tablet (4 mg total) by mouth Nightly. Park Meo, FNP  Active   zolpidem (AMBIEN) 5 MG tablet [id number]  TAKE 1 TABLET BY MOUTH AT BEDTIME AS NEEDED FOR SLEEP Park Meo, FNP  Active             Assessment/Plan:   Diabetes: - Currently uncontrolled - Reviewed long term cardiovascular and renal outcomes of uncontrolled blood sugar - Reviewed goal A1c, goal fasting, and goal 2 hour post prandial glucose - Reviewed dietary modifications including FOLLOWING A HEART HEALTHY DIET/HEALTHY PLATE METHOD - Reviewed lifestyle modifications including: increased physical activity - Recommend to seek Ozempic patient assistance via novo nordisk PAP  Messaged PCP to see if sample available  Message online novo PAP link to patient's mychart  Continue glipizide - Patient denies personal or family history of multiple endocrine neoplasia type 2, medullary thyroid cancer; personal history of pancreatitis or gallbladder disease. - Recommend to check glucose daily (fasting) or if symptomatic - Meets financial criteria for ozempic patient assistance program through novo PAP. Will collaborate with provider, CPhT, and patient to pursue assistance.     Follow Up Plan: 1 week  Kieth Brightly, PharmD, BCACP, CPP Clinical Pharmacist, [facility]

## 2023-04-28 ENCOUNTER — Telehealth: Payer: Self-pay

## 2023-04-28 NOTE — Telephone Encounter (Signed)
 Patient portion of PAP Novo Nordisk (Ozempic) was e-filed and provider pages  faxed to Provider for review.

## 2023-05-02 ENCOUNTER — Telehealth: Payer: Self-pay

## 2023-05-02 ENCOUNTER — Ambulatory Visit: Payer: Self-pay | Admitting: *Deleted

## 2023-05-02 NOTE — Telephone Encounter (Signed)
  Chief Complaint: cough, green mucus, runny nose Symptoms: runny nose cough x 1 week. Taking mucinex , now coughing up green mucus since last night. Coughing spells feels like going to throw up. Chest tightness after coughing  Frequency: 1 week  Pertinent Negatives: Patient denies chest pain no difficulty breathing no fever Disposition: [] ED /[] Urgent Care (no appt availability in office) / [x] Appointment(In office/virtual)/ []  [place] [facility]/ [] Home Care/ [] Refused Recommended Disposition /[] [place] [facility]/ []  Follow-up with PCP Additional Notes:   Appt scheduled for tomorrow.      Copied from CRM [id number]. Topic: Clinical - Red Word Triage >> May 02, 2023 11:03 AM Desma Mcgregor wrote: Red Word that prompted transfer to Nurse Triage: Since last night, coughing up green mucous. Also very stuffy for about a week now. Not sure if she needs to come in to be seen. Reason for Disposition  SEVERE coughing spells (e.g., whooping sound after coughing, vomiting after coughing)  Answer Assessment - Initial Assessment Questions 1. ONSET: "When did the cough begin?"      Last week  coughing up green last night  2. SEVERITY: "How bad is the cough today?"      Coughing spells  3. SPUTUM: "Describe the color of your sputum" (none, dry cough; clear, white, yellow, green)     Green  4. HEMOPTYSIS: "Are you coughing up any blood?" If so ask: "How much?" (flecks, streaks, tablespoons, etc.)     Na  5. DIFFICULTY BREATHING: "Are you having difficulty breathing?" If Yes, ask: "How bad is it?" (e.g., mild, moderate, severe)    - MILD: No SOB at rest, mild SOB with walking, speaks normally in sentences, can lie down, no retractions, pulse < 100.    - MODERATE: SOB at rest, SOB with minimal exertion and prefers to sit, cannot lie down flat, speaks in phrases, mild retractions, audible wheezing, pulse 100-120.    - SEVERE: Very SOB at rest, speaks in single words, struggling to breathe, sitting  hunched forward, retractions, pulse > 120      Chest tight after coughing no SOB 6. FEVER: "Do you have a fever?" If Yes, ask: "What is your temperature, how was it measured, and when did it start?"     na 7. CARDIAC HISTORY: "Do you have any history of heart disease?" (e.g., heart attack, congestive heart failure)      na 8. LUNG HISTORY: "Do you have any history of lung disease?"  (e.g., pulmonary embolus, asthma, emphysema)     Hx pneumonia  9. PE RISK FACTORS: "Do you have a history of blood clots?" (or: recent major surgery, recent prolonged travel, bedridden)     na 10. OTHER SYMPTOMS: "Do you have any other symptoms?" (e.g., runny nose, wheezing, chest pain)       Runny nose, cough up green mucus, coughing spells. Chest tightness with coughing  denies SOB .  11. PREGNANCY: "Is there any chance you are pregnant?" "When was your last menstrual period?"       na 12. TRAVEL: "Have you traveled out of the country in the last month?" (e.g., travel history, exposures)       na  Protocols used: Cough - Acute Productive-A-AH

## 2023-05-02 NOTE — Telephone Encounter (Signed)
 Arta Silence, CMA  Jazae Gandolfi, Mendel Ryder, LPN; Park Meo, FNP      Previous Messages    ----- Message ----- From: Danella Maiers, [facility] Sent: 04/27/2023  11:20 AM EDT To: Park Meo, FNP; Arta Silence, CMA Subject: ozempic sample                                Hi Amber,  We should be able to get ozempic via patient assistance (novo nordisk patient assistance).  None of the blood thinners have accessible assistance right now.  Ozempic will take about 4 weeks to get to your office (will fax you the paperwork to complete).  Do you have a sample for this patient to get started now? No worries if not.  We counseled on glipizide and diet/lifestyle in the meantime.  Thanks for all y'all do! Raynelle Fanning, PharmD

## 2023-05-02 NOTE — Telephone Encounter (Signed)
 Copied from CRM [id number]. Topic: General - Other >> May 02, 2023  2:26 PM Shelah Lewandowsky wrote: Reason for CRM: Alyssa with [employer] still waiting to hear from provider about sax sent

## 2023-05-03 ENCOUNTER — Ambulatory Visit: Admitting: Family Medicine

## 2023-05-04 ENCOUNTER — Ambulatory Visit: Admitting: Family Medicine

## 2023-05-04 NOTE — Telephone Encounter (Signed)
 PAP: Application for Ozempic has been submitted to [employer], via fax

## 2023-05-10 ENCOUNTER — Other Ambulatory Visit: Payer: Self-pay | Admitting: Family Medicine

## 2023-05-10 ENCOUNTER — Ambulatory Visit: Admitting: Family Medicine

## 2023-05-11 NOTE — Progress Notes (Signed)
 Pharmacy Medication Assistance Program Note    05/11/2023  Patient ID: [name], female   DOB: 01/19/1961, 63 y.o.   MRN: [id number]     05/11/2023  Outreach Medication One  Initial Outreach Date (Medication One) 04/28/2023  Manufacturer Medication One [employer] [facility] Ozempic  Type of Assistance Manufacturer Assistance  Date Application Sent to Patient 04/28/2023  Application Items Requested Application  Date Application Sent to Prescriber 04/28/2023  Name of Prescriber Kurtis Bushman  Date Application Received From Patient 04/28/2023  Application Items Received From Patient Application  Date Application Received From Provider 05/04/2023  Date Application Submitted to Manufacturer 05/04/2023  Method Application Sent to Manufacturer Fax  Patient Assistance Determination Approved  Approval Start Date 05/10/2023  Approval End Date 02/08/2024  Patient Notification Method MyChart

## 2023-05-11 NOTE — Telephone Encounter (Signed)
 PAP: Patient assistance application for Ozempic has been approved by PAP Companies: NovoNordisk from 05/10/2023 to 02/08/2024. Medication should be delivered to PAP Delivery: Provider's office. For further shipping updates, please [employer] at [phone]. Patient ID is: [id number]

## 2023-05-16 ENCOUNTER — Ambulatory Visit: Payer: Self-pay | Admitting: Family Medicine

## 2023-05-16 DIAGNOSIS — Z89511 Acquired absence of right leg below knee: Secondary | ICD-10-CM | POA: Diagnosis not present

## 2023-05-16 NOTE — Telephone Encounter (Signed)
 The call was dropped during call. This RN made first attempt to contact pt and got the recording, "Your call could not be completed as dialed."   Chief Complaint: Green poop intermittent x2 weeks  Symptoms: Abdomen pain, nausea, fatigue Pertinent Negatives: Patient denies blood in stool  Disposition: [] ED /[] Urgent Care (no appt availability in office) / [x] Appointment(In office/virtual)/ []  [place] [facility]/ [] Home Care/ [] Refused Recommended Disposition /[] [facility]/ []  Follow-up with PCP  Additional Notes: This RN recommends pt schedules an appointment.   Copied from CRM [id number]. Topic: Clinical - Red Word Triage >> May 16, 2023  3:38 PM Patsy Lager T wrote: Red Word that prompted transfer to Nurse Triage: patient called stated she has been experiencing stomach pain with green runny poop off and on for 2 weeks now. She says every time she eat it happens. Reason for Disposition  [1] Abnormal color is unexplained AND [2] persists > 24 hours  Answer Assessment - Initial Assessment Questions COLOR: "What color is it?" "Is that color in part or all of the stool?"     Green  ONSET: "When was the unusual color first noted?"     2 weeks OTHER SYMPTOMS: "Do you have any other symptoms?" (e.g., abdomen pain, diarrhea, jaundice, fever).     Diarrhea  Protocols used: Stools - Unusual Color-A-AH

## 2023-05-16 NOTE — Telephone Encounter (Signed)
 This RN received a transferred call from a patient who was disconnected from the previous Triage RN. Patient states that nothing has changed since their conversation, and that the Nurse was looking for an appointment for her when they got disconnected.  This RN set up an appointment for the patient for this Wednesday 05/18/2023 at 12pm with patient's PCP Kurtis Bushman, FNP.   Patient is also advised that if anything worsens or changes or if there is anything that she is concerned about, she can either call us back or go straight to the Emergency Room.  Patient verbalized understanding.

## 2023-05-17 ENCOUNTER — Ambulatory Visit: Admitting: Family Medicine

## 2023-05-18 ENCOUNTER — Encounter: Payer: Self-pay | Admitting: Family Medicine

## 2023-05-18 ENCOUNTER — Ambulatory Visit (INDEPENDENT_AMBULATORY_CARE_PROVIDER_SITE_OTHER): Admitting: Family Medicine

## 2023-05-18 VITALS — BP 116/78 | HR 110 | Temp 98.2°F | Ht 61.0 in | Wt 201.1 lb

## 2023-05-18 DIAGNOSIS — J069 Acute upper respiratory infection, unspecified: Secondary | ICD-10-CM | POA: Diagnosis not present

## 2023-05-18 MED ORDER — FLUTICASONE PROPIONATE 50 MCG/ACT NA SUSP: 2.0000 | Freq: Every day | NASAL | 6 refills | Status: AC
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
## 2023-05-18 NOTE — Assessment & Plan Note (Signed)
 Symptoms overall improving. Ongoing postnasal drip. Start Flonase daily. Reassured patient that symptoms and exam findings are most consistent with a viral upper respiratory infection and explained lack of efficacy of antibiotics against viruses.  Discussed expected course and features suggestive of secondary bacterial infection.  Continue supportive care. Increase fluid intake with water or electrolyte solution like pedialyte. Encouraged acetaminophen as needed for fever/pain. Encouraged salt water gargling, chloraseptic spray and throat lozenges. Encouraged OTC guaifenesin. Encouraged saline sinus flushes and/or neti with humidified air.

## 2023-05-19 ENCOUNTER — Other Ambulatory Visit: Payer: Self-pay | Admitting: Family Medicine

## 2023-05-19 ENCOUNTER — Ambulatory Visit: Admitting: Family Medicine

## 2023-05-20 ENCOUNTER — Ambulatory Visit (INDEPENDENT_AMBULATORY_CARE_PROVIDER_SITE_OTHER)

## 2023-05-20 DIAGNOSIS — E1165 Type 2 diabetes mellitus with hyperglycemia: Secondary | ICD-10-CM

## 2023-05-20 LAB — HM DIABETES EYE EXAM

## 2023-05-20 NOTE — Progress Notes (Signed)
 [name] arrived 05/20/2023 and has given verbal consent to obtain images and complete their overdue diabetic retinal screening.  The images have been sent to an ophthalmologist or optometrist for review and interpretation.  Results will be sent back to Park Meo, FNP for review.  Patient has been informed they will be contacted when we receive the results via telephone or MyChart

## 2023-05-24 ENCOUNTER — Ambulatory Visit: Admitting: Family Medicine

## 2023-05-25 NOTE — Progress Notes (Signed)
 [facility] report received. Pt is negative for diabetic retinopathy to both eyes. Pt advised via My Chart message. Report given to PCP. Mjp,lpn

## 2023-05-26 ENCOUNTER — Ambulatory Visit: Payer: Self-pay

## 2023-05-26 NOTE — Telephone Encounter (Signed)
 Copied from CRM [id number]. Topic: Clinical - Red Word Triage >> May 26, 2023  7:57 AM Baldemar Lev wrote: Red Word that prompted transfer to Nurse Triage: Feeling down for a week, does not feel like doing anything or going anywhere. She is super irritable, knows this is unusual. Mentioned mood swings. Depression.    Chief Complaint: Feeling irritable, depressed Symptoms: Above Frequency: 1 week Pertinent Negatives: Patient denies thoughts of harming self Disposition: [] ED /[] Urgent Care (no appt availability in office) / [x] Appointment(In office/virtual)/ []  [place] [facility]/ [] Home Care/ [] Refused Recommended Disposition /[] [place] [facility]/ []  Follow-up with PCP Additional Notes: Agrees with appointment.  Reason for Disposition  [1] Depression AND [2] worsening (e.g., sleeping poorly, less able to do activities of daily living)  Answer Assessment - Initial Assessment Questions 1. CONCERN: "What happened that made you call today?"     Depressed, irritable 2. DEPRESSION SYMPTOM SCREENING: "How are you feeling overall?" (e.g., decreased energy, increased sleeping or difficulty sleeping, difficulty concentrating, feelings of sadness, guilt, hopelessness, or worthlessness)     Sad 3. RISK OF HARM - SUICIDAL IDEATION:  "Do you ever have thoughts of hurting or killing yourself?"  (e.g., yes, no, no but preoccupation with thoughts about death)   - INTENT:  "Do you have thoughts of hurting or killing yourself right NOW?" (e.g., yes, no, N/A)   - PLAN: "Do you have a specific plan for how you would do this?" (e.g., gun, knife, overdose, no plan, N/A)     no 4. RISK OF HARM - HOMICIDAL IDEATION:  "Do you ever have thoughts of hurting or killing someone else?"  (e.g., yes, no, no but preoccupation with thoughts about death)   - INTENT:  "Do you have thoughts of hurting or killing someone right NOW?" (e.g., yes, no, N/A)   - PLAN: "Do you have a specific plan for how you would do this?"  (e.g., gun, knife, no plan, N/A)      no 5. FUNCTIONAL IMPAIRMENT: "How have things been going for you overall? Have you had more difficulty than usual doing your normal daily activities?"  (e.g., better, same, worse; self-care, school, work, interactions)     Don't feel like doing anything 6. SUPPORT: "Who is with you now?" "Who do you live with?" "Do you have family or friends who you can talk to?"      Family 7. THERAPIST: "Do you have a counselor or therapist? Name?"     No 8. STRESSORS: "Has there been any new stress or recent changes in your life?"     No 9. ALCOHOL USE OR SUBSTANCE USE (DRUG USE): "Do you drink alcohol or use any illegal drugs?"     No 10. OTHER: "Do you have any other physical symptoms right now?" (e.g., fever)       No 11. PREGNANCY: "Is there any chance you are pregnant?" "When was your last menstrual period?"       No  Protocols used: Depression-A-AH

## 2023-05-30 ENCOUNTER — Encounter: Payer: Self-pay | Admitting: Family Medicine

## 2023-05-30 ENCOUNTER — Ambulatory Visit (INDEPENDENT_AMBULATORY_CARE_PROVIDER_SITE_OTHER): Admitting: Family Medicine

## 2023-05-30 ENCOUNTER — Other Ambulatory Visit: Payer: Self-pay | Admitting: Family Medicine

## 2023-05-30 VITALS — BP 136/88 | HR 86 | Temp 98.6°F | Ht 61.0 in | Wt 202.0 lb

## 2023-05-30 DIAGNOSIS — F321 Major depressive disorder, single episode, moderate: Secondary | ICD-10-CM | POA: Diagnosis not present

## 2023-05-30 MED ORDER — ESCITALOPRAM OXALATE 10 MG PO TABS: 10.0000 mg | ORAL_TABLET | Freq: Every day | ORAL | 1 refills | Status: DC
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
## 2023-06-06 ENCOUNTER — Encounter: Payer: Self-pay | Admitting: Family Medicine

## 2023-06-07 ENCOUNTER — Other Ambulatory Visit: Payer: Self-pay | Admitting: Family Medicine

## 2023-06-07 ENCOUNTER — Telehealth: Payer: Self-pay

## 2023-06-07 NOTE — Telephone Encounter (Signed)
 Copied from CRM [id number]. Topic: Clinical - Medical Advice >> Jun 07, 2023  9:24 AM Yolande Hench C wrote: Reason for CRM: Patient called to inform PCP she does not feel any difference from the new medication and she has been taking it for a week; patient has requested a follow up call about what her next steps will be #[phone]

## 2023-06-09 ENCOUNTER — Encounter: Payer: Self-pay | Admitting: Pharmacist

## 2023-06-20 ENCOUNTER — Encounter (HOSPITAL_COMMUNITY): Payer: Self-pay

## 2023-06-23 DIAGNOSIS — R109 Unspecified abdominal pain: Secondary | ICD-10-CM | POA: Diagnosis not present

## 2023-06-23 DIAGNOSIS — R197 Diarrhea, unspecified: Secondary | ICD-10-CM | POA: Diagnosis not present

## 2023-06-23 DIAGNOSIS — R140 Abdominal distension (gaseous): Secondary | ICD-10-CM | POA: Diagnosis not present

## 2023-06-27 ENCOUNTER — Encounter (HOSPITAL_COMMUNITY): Payer: Self-pay | Admitting: Physician Assistant

## 2023-06-27 ENCOUNTER — Ambulatory Visit: Admitting: Family Medicine

## 2023-06-27 ENCOUNTER — Other Ambulatory Visit (HOSPITAL_COMMUNITY): Payer: Self-pay | Admitting: Physician Assistant

## 2023-06-27 DIAGNOSIS — R109 Unspecified abdominal pain: Secondary | ICD-10-CM

## 2023-06-27 DIAGNOSIS — R197 Diarrhea, unspecified: Secondary | ICD-10-CM

## 2023-06-28 ENCOUNTER — Ambulatory Visit (HOSPITAL_COMMUNITY)
Admission: RE | Admit: 2023-06-28 | Discharge: 2023-06-28 | Disposition: A | Discharge status: Home / Self Care | Source: Ambulatory Visit | Attending: Physician Assistant | Admitting: Physician Assistant

## 2023-06-28 DIAGNOSIS — R197 Diarrhea, unspecified: Secondary | ICD-10-CM | POA: Insufficient documentation

## 2023-06-28 DIAGNOSIS — R1030 Lower abdominal pain, unspecified: Secondary | ICD-10-CM | POA: Diagnosis not present

## 2023-06-29 DIAGNOSIS — R140 Abdominal distension (gaseous): Secondary | ICD-10-CM | POA: Diagnosis not present

## 2023-06-29 DIAGNOSIS — R109 Unspecified abdominal pain: Secondary | ICD-10-CM | POA: Diagnosis not present

## 2023-06-29 DIAGNOSIS — R197 Diarrhea, unspecified: Secondary | ICD-10-CM | POA: Diagnosis not present

## 2023-07-06 ENCOUNTER — Ambulatory Visit: Payer: Self-pay

## 2023-07-06 NOTE — Telephone Encounter (Signed)
 Copied From CRM [id number]. Reason for Triage: patient believes she has a uti, burning and itching. [id number]    Chief Complaint: Pain with urination Symptoms: burning and odor Frequency: constant Pertinent Negatives: Patient denies fever Disposition: [] ED /[] Urgent Care (no appt availability in office) / [x] Appointment(In office/virtual)/ []  [facility]/ [] Home Care/ [] Refused Recommended Disposition /[] [facility]/ []  Follow-up with PCP Additional Notes: Appt scheduled for 5/29  Reason for Disposition  Bad or foul-smelling urine  Answer Assessment - Initial Assessment Questions 1. SYMPTOM: "What's the main symptom you're concerned about?" (e.g., frequency, incontinence)     Burning with urination   2. ONSET: "When did the  discomfort   start?"     Last saturday  3. PAIN: "Is there any pain?" If Yes, ask: "How bad is it?" (Scale: 1-10; mild, moderate, severe)     8/10 pain  4. CAUSE: "What do you think is causing the symptoms?"     UTI  5. OTHER SYMPTOMS: "Do you have any other symptoms?" (e.g., blood in urine, fever, flank pain, pain with urination)     Foul odor, vaginal itching after itchy  6. PREGNANCY: "Is there any chance you are pregnant?" "When was your last menstrual period?"     *No Answer*  Protocols used: Urinary Symptoms-A-AH

## 2023-07-07 ENCOUNTER — Ambulatory Visit: Admitting: Family Medicine

## 2023-07-11 ENCOUNTER — Ambulatory Visit: Admitting: Family Medicine

## 2023-07-12 ENCOUNTER — Ambulatory Visit: Admitting: Family Medicine

## 2023-07-20 ENCOUNTER — Ambulatory Visit: Admitting: Family Medicine

## 2023-07-26 ENCOUNTER — Encounter: Payer: Medicare HMO | Admitting: Family Medicine

## 2023-08-02 ENCOUNTER — Encounter: Payer: Self-pay | Admitting: Family Medicine

## 2023-08-02 ENCOUNTER — Ambulatory Visit (INDEPENDENT_AMBULATORY_CARE_PROVIDER_SITE_OTHER): Admitting: Family Medicine

## 2023-08-02 VITALS — BP 130/82 | HR 96 | Temp 97.8°F | Resp 18 | Ht 61.0 in | Wt 202.6 lb

## 2023-08-02 DIAGNOSIS — R42 Dizziness and giddiness: Secondary | ICD-10-CM

## 2023-08-02 DIAGNOSIS — G43009 Migraine without aura, not intractable, without status migrainosus: Secondary | ICD-10-CM

## 2023-08-02 DIAGNOSIS — E1165 Type 2 diabetes mellitus with hyperglycemia: Secondary | ICD-10-CM | POA: Diagnosis not present

## 2023-08-02 MED ORDER — ONDANSETRON HCL 4 MG PO TABS: 4.0000 mg | ORAL_TABLET | Freq: Three times a day (TID) | ORAL | 0 refills | Status: AC | PRN

## 2023-08-02 MED ORDER — MECLIZINE HCL 25 MG PO TABS
25.0000 mg | ORAL_TABLET | Freq: Three times a day (TID) | ORAL | 0 refills | Status: AC | PRN
Start: 2023-08-02 — End: ?
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
## 2023-08-03 ENCOUNTER — Other Ambulatory Visit: Payer: Self-pay | Admitting: Family Medicine

## 2023-08-03 DIAGNOSIS — E119 Type 2 diabetes mellitus without complications: Secondary | ICD-10-CM

## 2023-08-03 DIAGNOSIS — E1165 Type 2 diabetes mellitus with hyperglycemia: Secondary | ICD-10-CM

## 2023-08-10 ENCOUNTER — Ambulatory Visit: Admitting: Family Medicine

## 2023-08-18 ENCOUNTER — Ambulatory Visit: Admitting: Family Medicine

## 2023-08-18 ENCOUNTER — Encounter: Payer: Self-pay | Admitting: Family Medicine

## 2023-08-18 VITALS — BP 119/85 | HR 84 | Temp 98.2°F | Ht 61.0 in | Wt 202.2 lb

## 2023-08-18 DIAGNOSIS — E1165 Type 2 diabetes mellitus with hyperglycemia: Secondary | ICD-10-CM | POA: Diagnosis not present

## 2023-08-18 DIAGNOSIS — Z86711 Personal history of pulmonary embolism: Secondary | ICD-10-CM | POA: Diagnosis not present

## 2023-08-18 DIAGNOSIS — Z7984 Long term (current) use of oral hypoglycemic drugs: Secondary | ICD-10-CM

## 2023-08-18 DIAGNOSIS — R140 Abdominal distension (gaseous): Secondary | ICD-10-CM | POA: Diagnosis not present

## 2023-08-18 DIAGNOSIS — R109 Unspecified abdominal pain: Secondary | ICD-10-CM | POA: Insufficient documentation

## 2023-08-18 MED ORDER — SIMETHICONE 125 MG PO CHEW: 125.0000 mg | CHEWABLE_TABLET | Freq: Four times a day (QID) | ORAL | 0 refills | Status: AC | PRN

## 2023-08-18 MED ORDER — RIVAROXABAN 20 MG PO TABS: 20.0000 mg | ORAL_TABLET | Freq: Every day | ORAL | 3 refills | Status: DC

## 2023-08-18 NOTE — Assessment & Plan Note (Signed)
 A1c today and will adjust Ozempic  as indicated, will DC metformin  if well controlled

## 2023-08-18 NOTE — Assessment & Plan Note (Signed)
 No jaundice, icterus, lower extremity edema, or appearance of ascites. Bowel movements are normal. No red flags on exam. She declined imaging, would just like labs today. Will start Simethicone  for symptom relief. She should start a food diary. Will DC metformin  if BG well controlled on A1c. Follow up PRN.

## 2023-08-18 NOTE — Assessment & Plan Note (Signed)
 Not currently on anticoagulation. Reordered Xarelto , unsure why she is not taking this.
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
## 2023-08-19 LAB — COMPREHENSIVE METABOLIC PANEL WITH GFR
AG Ratio: 1.8 (calc) (ref 1.0–2.5)
ALT: 53 U/L — ABNORMAL HIGH (ref 6–29)
AST: 59 U/L — ABNORMAL HIGH (ref 10–35)
Albumin: 4.2 g/dL (ref 3.6–5.1)
Alkaline phosphatase (APISO): 90 U/L (ref 37–153)
BUN: 13 mg/dL (ref 7–25)
CO2: 32 mmol/L (ref 20–32)
Calcium: 9.4 mg/dL (ref 8.6–10.4)
Chloride: 101 mmol/L (ref 98–110)
Creat: 0.62 mg/dL (ref 0.50–1.05)
Globulin: 2.3 g/dL (ref 1.9–3.7)
Glucose, Bld: 94 mg/dL (ref 65–99)
Potassium: 4.3 mmol/L (ref 3.5–5.3)
Sodium: 140 mmol/L (ref 135–146)
Total Bilirubin: 0.3 mg/dL (ref 0.2–1.2)
Total Protein: 6.5 g/dL (ref 6.1–8.1)
eGFR: 101 mL/min/1.73m2 (ref >=60)

## 2023-08-19 LAB — HEMOGLOBIN A1C
Hgb A1c MFr Bld: 8.3 % — ABNORMAL HIGH (ref <5.7)
Mean Plasma Glucose: 192 mg/dL
eAG (mmol/L): 10.6 mmol/L

## 2023-08-22 ENCOUNTER — Ambulatory Visit: Payer: Self-pay | Admitting: Family Medicine

## 2023-08-22 ENCOUNTER — Other Ambulatory Visit: Payer: Self-pay | Admitting: Family Medicine

## 2023-08-22 DIAGNOSIS — R7401 Elevation of levels of liver transaminase levels: Secondary | ICD-10-CM

## 2023-08-23 ENCOUNTER — Inpatient Hospital Stay
Admission: RE | Admit: 2023-08-23 | Discharge: 2023-08-23 | Discharge status: Home / Self Care | Source: Ambulatory Visit | Attending: Family Medicine

## 2023-08-23 DIAGNOSIS — M722 Plantar fascial fibromatosis: Secondary | ICD-10-CM | POA: Diagnosis not present

## 2023-08-23 DIAGNOSIS — R7401 Elevation of levels of liver transaminase levels: Secondary | ICD-10-CM | POA: Diagnosis not present

## 2023-08-24 ENCOUNTER — Ambulatory Visit: Payer: Self-pay | Admitting: Family Medicine

## 2023-08-25 ENCOUNTER — Telehealth: Payer: Self-pay

## 2023-08-25 NOTE — Telephone Encounter (Signed)
 Copied from CRM [id number]. Topic: Clinical - Lab/Test Results >> Aug 25, 2023  8:30 AM Fonda T wrote: Reason for CRM: Patient is calling requesting a return call to discuss imagining results for ultrasound done on 08/23/23.  Patient can be reached [phone].   Thank you

## 2023-08-26 NOTE — Progress Notes (Signed)
 Not able to leave vm due to number not available at this time . Sent result letter by my chart.

## 2023-08-29 ENCOUNTER — Other Ambulatory Visit: Payer: Self-pay | Admitting: Family Medicine

## 2023-08-30 ENCOUNTER — Telehealth: Payer: Self-pay

## 2023-08-30 DIAGNOSIS — H524 Presbyopia: Secondary | ICD-10-CM | POA: Diagnosis not present

## 2023-08-30 DIAGNOSIS — E119 Type 2 diabetes mellitus without complications: Secondary | ICD-10-CM | POA: Diagnosis not present

## 2023-08-30 NOTE — Telephone Encounter (Signed)
 Copied from CRM #[id number]. Topic: Clinical - Medical Advice >> Aug 30, 2023  2:27 PM [name] wrote: Reason for CRM: Patient states she is still having stomach issues after ultrasound showed everything was normal. Her GI doctor will be out till August and is wondering if PCP can offer any guidance/advice on how to move forward. Please reach out # [phone]

## 2023-09-03 IMAGING — DX DG CHEST 1V PORT
1 series · 1 of 1 positions shown · non-contrast
Comparison: CT angiogram chest 05/18/2010. Prior chest radiographs
05/22/2004.

CLINICAL DATA: Provided history: Shortness of breath. Abnormal EKG.
History of hypertension.

EXAM:
PORTABLE CHEST 1 VIEW

[chest ap]
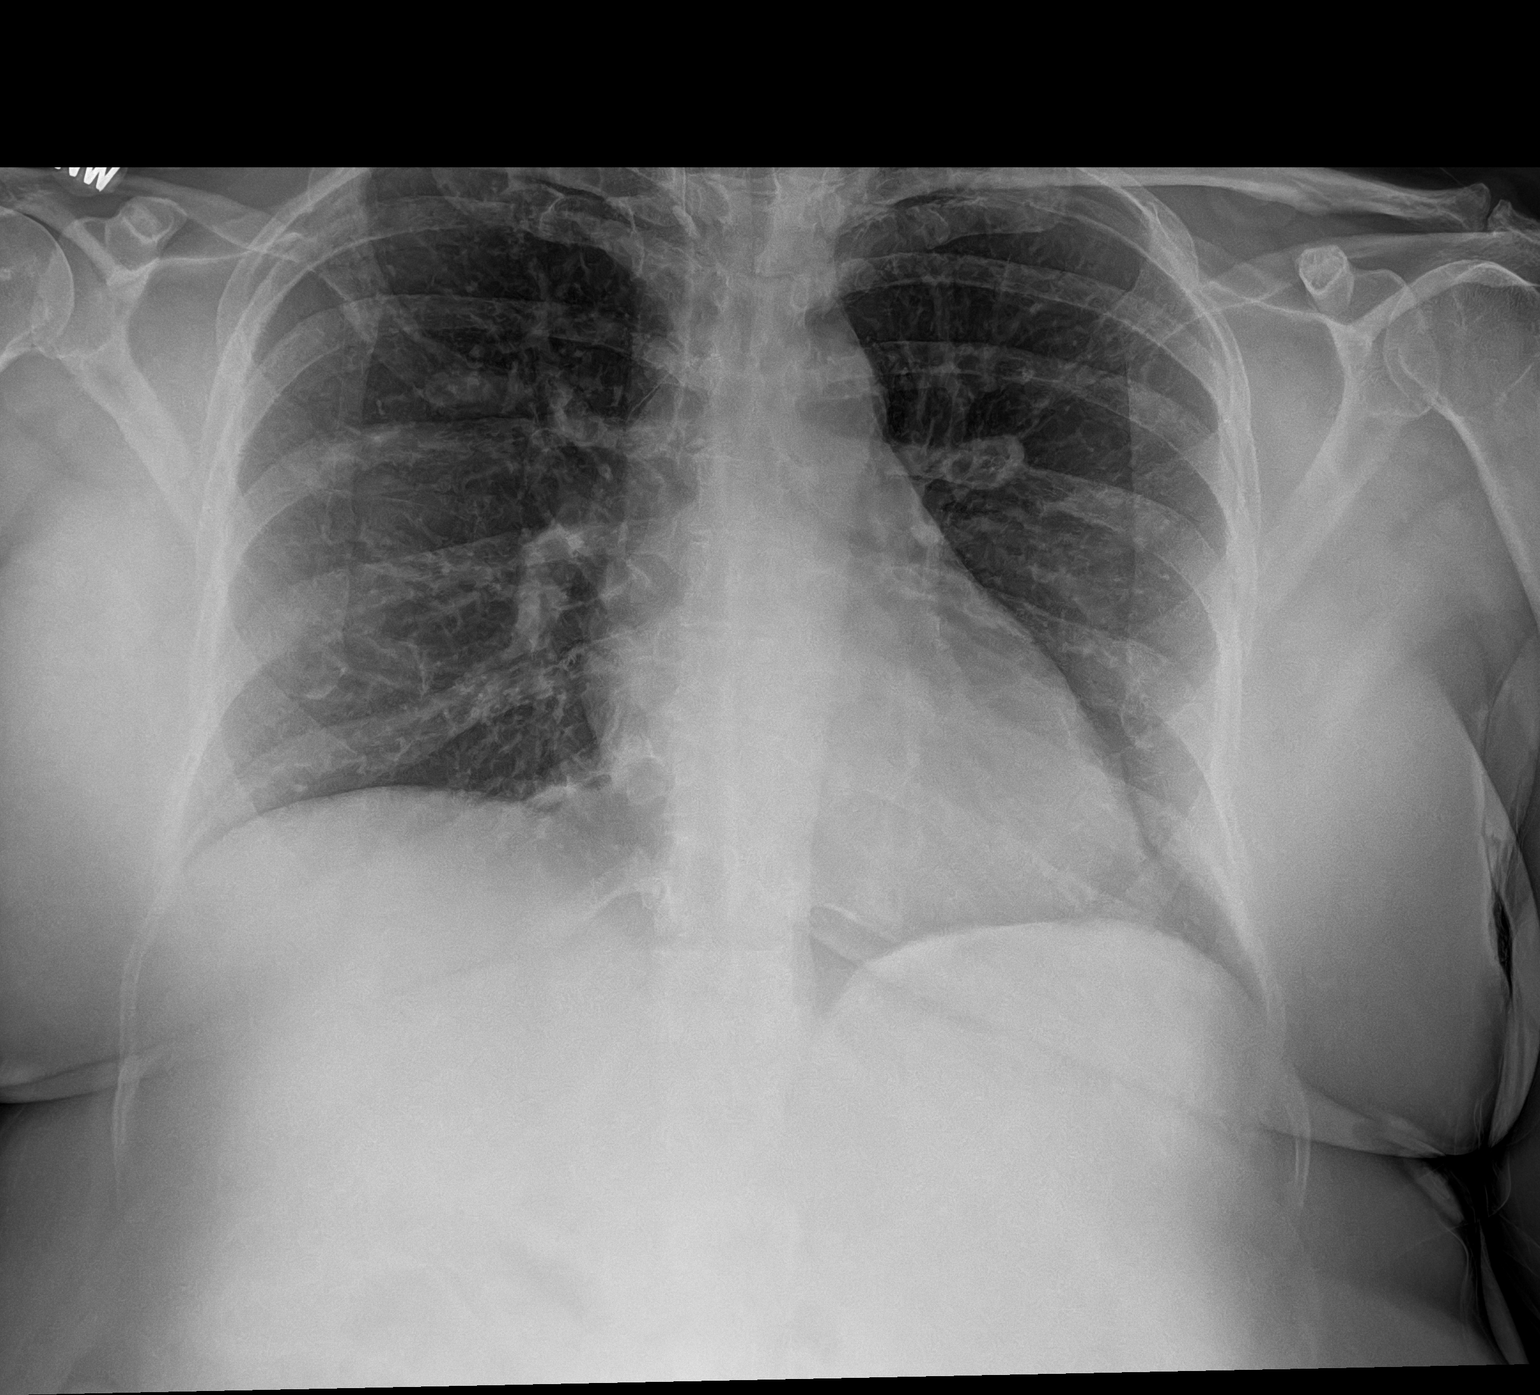

[1 of 1 positions shown; findings below may reference images not displayed]

FINDINGS: Heart size within normal limits. No appreciable airspace
consolidation. No evidence of pleural effusion or pneumothorax. No
acute bony abnormality identified.
IMPRESSION: No evidence of active cardiopulmonary disease.

## 2023-09-05 ENCOUNTER — Ambulatory Visit: Admitting: Family Medicine

## 2023-09-12 DIAGNOSIS — D75839 Thrombocytosis, unspecified: Secondary | ICD-10-CM | POA: Diagnosis not present

## 2023-09-12 DIAGNOSIS — Z89511 Acquired absence of right leg below knee: Secondary | ICD-10-CM | POA: Diagnosis not present

## 2023-09-12 DIAGNOSIS — R7401 Elevation of levels of liver transaminase levels: Secondary | ICD-10-CM | POA: Diagnosis not present

## 2023-09-12 DIAGNOSIS — D72820 Lymphocytosis (symptomatic): Secondary | ICD-10-CM | POA: Diagnosis not present

## 2023-09-12 DIAGNOSIS — R1011 Right upper quadrant pain: Secondary | ICD-10-CM | POA: Diagnosis not present

## 2023-09-12 DIAGNOSIS — R7989 Other specified abnormal findings of blood chemistry: Secondary | ICD-10-CM | POA: Diagnosis not present

## 2023-09-12 DIAGNOSIS — E119 Type 2 diabetes mellitus without complications: Secondary | ICD-10-CM | POA: Diagnosis not present

## 2023-09-12 DIAGNOSIS — E66812 Obesity, class 2: Secondary | ICD-10-CM | POA: Diagnosis not present

## 2023-09-13 ENCOUNTER — Other Ambulatory Visit: Payer: Self-pay | Admitting: Internal Medicine

## 2023-09-13 ENCOUNTER — Ambulatory Visit: Admitting: Family Medicine

## 2023-09-13 DIAGNOSIS — R7989 Other specified abnormal findings of blood chemistry: Secondary | ICD-10-CM

## 2023-09-14 ENCOUNTER — Encounter: Payer: Self-pay | Admitting: Oncology

## 2023-09-21 ENCOUNTER — Ambulatory Visit (INDEPENDENT_AMBULATORY_CARE_PROVIDER_SITE_OTHER): Admitting: Family Medicine

## 2023-09-21 ENCOUNTER — Encounter: Payer: Self-pay | Admitting: Family Medicine

## 2023-09-21 VITALS — BP 125/87 | HR 93 | Temp 98.5°F | Ht 61.0 in | Wt 198.2 lb

## 2023-09-21 DIAGNOSIS — N898 Other specified noninflammatory disorders of vagina: Secondary | ICD-10-CM | POA: Insufficient documentation

## 2023-09-21 DIAGNOSIS — R109 Unspecified abdominal pain: Secondary | ICD-10-CM | POA: Diagnosis not present

## 2023-09-21 DIAGNOSIS — R140 Abdominal distension (gaseous): Secondary | ICD-10-CM | POA: Diagnosis not present

## 2023-09-21 DIAGNOSIS — R300 Dysuria: Secondary | ICD-10-CM | POA: Insufficient documentation

## 2023-09-21 LAB — WET PREP FOR TRICH, YEAST, CLUE
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
## 2023-09-21 NOTE — Assessment & Plan Note (Signed)
 No N/V/D/C, melena, hematochezia, weight loss, fevers, jaundice, icterus, lower extremity edema, or appearance of ascites. Bowel movements are normal. No red flags on exam. Unrelieved with Simethicone . Will obtain abdominal pelvis CT. Continue with colonoscopy as scheduled and follow up with GI. Labs have been normal during this workup. Will consider DC Metformin  or Ozempic .

## 2023-09-21 NOTE — Assessment & Plan Note (Signed)
 Will return with urine for UA, unable to collect in office today

## 2023-09-21 NOTE — Assessment & Plan Note (Signed)
 No BV or yeast infection on wet prep

## 2023-09-23 ENCOUNTER — Other Ambulatory Visit: Payer: Self-pay | Admitting: Family Medicine

## 2023-09-26 ENCOUNTER — Inpatient Hospital Stay: Admission: RE | Admit: 2023-09-26 | Source: Ambulatory Visit

## 2023-10-03 ENCOUNTER — Ambulatory Visit: Admitting: Family Medicine

## 2023-10-04 ENCOUNTER — Other Ambulatory Visit: Payer: Self-pay | Admitting: Family Medicine

## 2023-10-04 ENCOUNTER — Encounter: Admitting: Family Medicine

## 2023-10-05 ENCOUNTER — Other Ambulatory Visit: Payer: Self-pay | Admitting: Family Medicine

## 2023-10-17 ENCOUNTER — Other Ambulatory Visit: Payer: Self-pay | Admitting: Family Medicine

## 2023-10-18 ENCOUNTER — Ambulatory Visit: Admitting: Family Medicine

## 2023-10-19 ENCOUNTER — Ambulatory Visit: Admitting: Family Medicine

## 2023-10-25 ENCOUNTER — Ambulatory Visit: Admitting: Family Medicine

## 2023-10-27 DIAGNOSIS — R198 Other specified symptoms and signs involving the digestive system and abdomen: Secondary | ICD-10-CM | POA: Diagnosis not present

## 2023-10-27 DIAGNOSIS — R140 Abdominal distension (gaseous): Secondary | ICD-10-CM | POA: Diagnosis not present

## 2023-10-27 DIAGNOSIS — R109 Unspecified abdominal pain: Secondary | ICD-10-CM | POA: Diagnosis not present

## 2023-11-02 ENCOUNTER — Other Ambulatory Visit: Payer: Self-pay | Admitting: Family Medicine

## 2023-11-02 ENCOUNTER — Ambulatory Visit: Admitting: Family Medicine

## 2023-11-02 ENCOUNTER — Encounter: Payer: Self-pay | Admitting: Family Medicine

## 2023-11-02 VITALS — BP 131/88 | HR 87 | Temp 97.9°F | Ht 61.0 in | Wt 199.2 lb

## 2023-11-02 DIAGNOSIS — F321 Major depressive disorder, single episode, moderate: Secondary | ICD-10-CM

## 2023-11-02 DIAGNOSIS — N3000 Acute cystitis without hematuria: Secondary | ICD-10-CM | POA: Diagnosis not present

## 2023-11-02 LAB — URINALYSIS, ROUTINE W REFLEX MICROSCOPIC
Bilirubin Urine: NEGATIVE
Glucose, UA: NEGATIVE
Hyaline Cast: NONE SEEN /LPF
Ketones, ur: NEGATIVE
Nitrite: NEGATIVE
Protein, ur: NEGATIVE
Specific Gravity, Urine: 1.01 (ref 1.001–1.035)
pH: 7 (ref 5.0–8.0)

## 2023-11-02 LAB — MICROSCOPIC MESSAGE

## 2023-11-02 MED ORDER — ZOLPIDEM TARTRATE 5 MG PO TABS: 5.0000 mg | ORAL_TABLET | Freq: Every evening | ORAL | 5 refills | Status: AC | PRN

## 2023-11-02 MED ORDER — CEPHALEXIN 500 MG PO CAPS: 500.0000 mg | ORAL_CAPSULE | Freq: Two times a day (BID) | ORAL | 0 refills | Status: AC

## 2023-11-02 NOTE — Assessment & Plan Note (Signed)
 Urine dipstick shows positive for RBC's and positive for leukocytes.  Micro exam: 10-20 WBC's per HPF, 0-2 RBC's per HPF, and few+ bacteria. Start Keflex  500mg  BID x7d Follow up PRN

## 2023-11-02 NOTE — Addendum Note (Signed)
 Addended by: KAYLA JEOFFREY RAMAN on: 11/02/2023 02:59 PM   Modules accepted: Orders
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
## 2023-11-09 ENCOUNTER — Telehealth: Payer: Self-pay

## 2023-11-09 DIAGNOSIS — R7401 Elevation of levels of liver transaminase levels: Secondary | ICD-10-CM | POA: Diagnosis not present

## 2023-11-09 DIAGNOSIS — D75839 Thrombocytosis, unspecified: Secondary | ICD-10-CM | POA: Diagnosis not present

## 2023-11-09 DIAGNOSIS — I2609 Other pulmonary embolism with acute cor pulmonale: Secondary | ICD-10-CM | POA: Diagnosis not present

## 2023-11-09 DIAGNOSIS — R1084 Generalized abdominal pain: Secondary | ICD-10-CM | POA: Diagnosis not present

## 2023-11-09 DIAGNOSIS — K760 Fatty (change of) liver, not elsewhere classified: Secondary | ICD-10-CM | POA: Diagnosis not present

## 2023-11-09 DIAGNOSIS — R197 Diarrhea, unspecified: Secondary | ICD-10-CM | POA: Diagnosis not present

## 2023-11-09 DIAGNOSIS — D72820 Lymphocytosis (symptomatic): Secondary | ICD-10-CM | POA: Diagnosis not present

## 2023-11-09 NOTE — Telephone Encounter (Signed)
 Copied from CRM [id number]. Topic: General - Other >> Nov 09, 2023  9:20 AM Everette C wrote: Reason for CRM: The patient has called for an update on the completion of their handicap placard application that was dropped off last week. Please contact the patient further when possible

## 2023-11-10 NOTE — Telephone Encounter (Signed)
 Form is with provider and is in process of completion. Pt will be contacted once form has been signed.

## 2023-11-11 NOTE — Telephone Encounter (Signed)
 Per patient request Provider Jeoffrey Barrio has completed [place] [employer] for Application and Renewal of Disability parking placard. Forms were signed and completed on 11/10/2023 Pt. Has been alerted and form is with the front desk for pick up.   11/11/2023 11:45 am SRP, CMA

## 2023-11-15 ENCOUNTER — Ambulatory Visit: Admitting: Family Medicine

## 2023-11-19 ENCOUNTER — Other Ambulatory Visit: Payer: Self-pay

## 2023-11-21 ENCOUNTER — Other Ambulatory Visit: Payer: Self-pay | Admitting: Family Medicine

## 2023-11-22 ENCOUNTER — Ambulatory Visit: Admitting: Family Medicine

## 2023-11-23 ENCOUNTER — Ambulatory Visit: Admitting: Family Medicine

## 2023-11-24 ENCOUNTER — Ambulatory Visit: Admitting: Family Medicine

## 2023-11-28 ENCOUNTER — Ambulatory Visit: Admitting: Family Medicine

## 2023-11-29 ENCOUNTER — Other Ambulatory Visit (HOSPITAL_COMMUNITY): Payer: Self-pay | Admitting: Family Medicine

## 2023-11-29 ENCOUNTER — Ambulatory Visit: Admitting: Family Medicine

## 2023-11-29 DIAGNOSIS — Z1231 Encounter for screening mammogram for malignant neoplasm of breast: Secondary | ICD-10-CM

## 2023-11-30 ENCOUNTER — Telehealth: Payer: Self-pay

## 2023-11-30 NOTE — Telephone Encounter (Signed)
 Completed refill form for Ozempic  and faxed provider office for review and signature.

## 2023-12-05 ENCOUNTER — Ambulatory Visit: Admitting: Family Medicine

## 2023-12-05 ENCOUNTER — Encounter (HOSPITAL_COMMUNITY)

## 2023-12-05 DIAGNOSIS — Z1231 Encounter for screening mammogram for malignant neoplasm of breast: Secondary | ICD-10-CM

## 2023-12-06 ENCOUNTER — Other Ambulatory Visit: Payer: Self-pay | Admitting: Family Medicine

## 2023-12-07 ENCOUNTER — Ambulatory Visit: Admitting: Family Medicine

## 2023-12-07 ENCOUNTER — Other Ambulatory Visit: Payer: Self-pay | Admitting: Family Medicine

## 2023-12-07 ENCOUNTER — Telehealth: Payer: Self-pay

## 2023-12-07 DIAGNOSIS — E119 Type 2 diabetes mellitus without complications: Secondary | ICD-10-CM

## 2023-12-07 NOTE — Telephone Encounter (Signed)
 Patent Assistance forms from [employer]/ Reorder/ Chage Request faxed in on 11/30/23 for pt. .   Request is for Ozempic  3ml pen  at 0.25 mg or 0.5 mg weekly for 4 weeks 1 pen pack for 4 weeks. 4 boxes.   Order signed by provider Jeoffrey Barrio on 12/06/23   Faxed to [facility]  Pharmacy Technician Suzen Mall, CPhT  [address]  [place], [place] [address] Office [phone] Fax [phone]

## 2023-12-20 ENCOUNTER — Telehealth: Payer: Self-pay

## 2023-12-20 NOTE — Telephone Encounter (Signed)
 Patient Assistance Delivery from :   [employer]   FOR : [name]   PRODUCT: [id number] 6pc [employer] - OZEMPIC  0.25,0.5 1X3 ML PREFILLED PEN  STRENGTH: .68 /ML  DOSAGE FORM : INJECTION SOLUTION  LOT NUMBER : [id number] EXPIRATION DATE 04/08/2026 UNIT QUANTITY: 3 NDC- [id number]   SHIPMENT : [id number] CUSTOMER [id number] PO NUMBER [id number] PO DATE 12-09-2023 SHIP DATE 12-19-2023   PATIENT WAS CONTACTED AND INFORMED BY PHONE OF RECEIPT OF MEDICATION . SHE STATED SHE WOULD BE IN THIS WEEK TO OBTAIN THE ORDER.   SRP, CMA. 12/20/23 10:01 AM

## 2023-12-28 ENCOUNTER — Ambulatory Visit: Admitting: Family Medicine

## 2024-01-04 ENCOUNTER — Telehealth: Payer: Self-pay | Admitting: Family Medicine

## 2024-01-04 NOTE — Telephone Encounter (Signed)
 Copied from CRM #[id number]. Topic: Clinical - Order For Equipment >> Jan 04, 2024  4:05 PM Winona R wrote: Pt calling to request orders for liners and sleeve for her leg. Order sent to [facility] in [place].

## 2024-01-10 ENCOUNTER — Ambulatory Visit: Admitting: Family Medicine

## 2024-01-11 ENCOUNTER — Ambulatory Visit: Admitting: Family Medicine

## 2024-01-12 ENCOUNTER — Ambulatory Visit: Admitting: Family Medicine

## 2024-01-18 ENCOUNTER — Ambulatory Visit: Admitting: Family Medicine

## 2024-01-18 ENCOUNTER — Encounter: Payer: Self-pay | Admitting: Family Medicine

## 2024-01-18 VITALS — BP 118/88 | HR 80 | Temp 97.7°F | Ht 61.0 in | Wt 194.4 lb

## 2024-01-18 DIAGNOSIS — Z0000 Encounter for general adult medical examination without abnormal findings: Secondary | ICD-10-CM

## 2024-01-18 DIAGNOSIS — K219 Gastro-esophageal reflux disease without esophagitis: Secondary | ICD-10-CM | POA: Insufficient documentation

## 2024-01-18 DIAGNOSIS — K760 Fatty (change of) liver, not elsewhere classified: Secondary | ICD-10-CM | POA: Diagnosis not present

## 2024-01-18 DIAGNOSIS — Z89511 Acquired absence of right leg below knee: Secondary | ICD-10-CM | POA: Diagnosis not present

## 2024-01-18 DIAGNOSIS — I10 Essential (primary) hypertension: Secondary | ICD-10-CM | POA: Diagnosis not present

## 2024-01-18 DIAGNOSIS — E119 Type 2 diabetes mellitus without complications: Secondary | ICD-10-CM

## 2024-01-18 DIAGNOSIS — F419 Anxiety disorder, unspecified: Secondary | ICD-10-CM

## 2024-01-18 DIAGNOSIS — F321 Major depressive disorder, single episode, moderate: Secondary | ICD-10-CM

## 2024-01-18 DIAGNOSIS — E559 Vitamin D deficiency, unspecified: Secondary | ICD-10-CM

## 2024-01-18 DIAGNOSIS — Z23 Encounter for immunization: Secondary | ICD-10-CM

## 2024-01-18 DIAGNOSIS — Z0001 Encounter for general adult medical examination with abnormal findings: Secondary | ICD-10-CM

## 2024-01-18 DIAGNOSIS — R200 Anesthesia of skin: Secondary | ICD-10-CM

## 2024-01-18 DIAGNOSIS — F5101 Primary insomnia: Secondary | ICD-10-CM | POA: Diagnosis not present

## 2024-01-18 DIAGNOSIS — E782 Mixed hyperlipidemia: Secondary | ICD-10-CM

## 2024-01-18 DIAGNOSIS — Z86711 Personal history of pulmonary embolism: Secondary | ICD-10-CM

## 2024-01-18 MED ORDER — LANCETS MISC: 1.0000 | 0 refills | Status: AC

## 2024-01-18 MED ORDER — RIVAROXABAN 20 MG PO TABS: 20.0000 mg | ORAL_TABLET | Freq: Every day | ORAL | 3 refills | Status: AC

## 2024-01-18 MED ORDER — BLOOD GLUCOSE MONITORING SUPPL DEVI: 1.0000 | 0 refills | Status: AC

## 2024-01-18 MED ORDER — LANCET DEVICE MISC: 1.0000 | 0 refills | Status: AC

## 2024-01-18 MED ORDER — BLOOD GLUCOSE TEST VI STRP: 1.0000 | ORAL_STRIP | 0 refills | Status: AC
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
## 2024-01-19 ENCOUNTER — Telehealth: Payer: Self-pay

## 2024-01-19 ENCOUNTER — Ambulatory Visit: Payer: Self-pay | Admitting: Family Medicine

## 2024-01-19 NOTE — Telephone Encounter (Signed)
 Copied from CRM #[id number]. Topic: Clinical - Order For Equipment >> Jan 19, 2024 10:03 AM Dedra B wrote: Reason for CRM: Pt needs a prescription for prosthetic liners sent to [facility].

## 2024-01-19 NOTE — Telephone Encounter (Signed)
 Copied from CRM #[id number]. Topic: Clinical - Medical Advice >> Jan 19, 2024 10:04 AM Dedra B wrote: Reason for CRM: Pt was unable to leave a urine sample yesterday and wants to know if she can drop by today or tomorrow to leave one. Pls let pt know.

## 2024-01-23 ENCOUNTER — Other Ambulatory Visit

## 2024-01-23 LAB — COMPREHENSIVE METABOLIC PANEL WITH GFR
AG Ratio: 1.7 (calc) (ref 1.0–2.5)
ALT: 67 U/L — ABNORMAL HIGH (ref 6–29)
AST: 67 U/L — ABNORMAL HIGH (ref 10–35)
Albumin: 4.4 g/dL (ref 3.6–5.1)
Alkaline phosphatase (APISO): 97 U/L (ref 37–153)
BUN: 16 mg/dL (ref 7–25)
CO2: 31 mmol/L (ref 20–32)
Calcium: 10.1 mg/dL (ref 8.6–10.4)
Chloride: 99 mmol/L (ref 98–110)
Creat: 0.63 mg/dL (ref 0.50–1.05)
Globulin: 2.6 g/dL (ref 1.9–3.7)
Glucose, Bld: 111 mg/dL — ABNORMAL HIGH (ref 65–99)
Potassium: 4.6 mmol/L (ref 3.5–5.3)
Sodium: 138 mmol/L (ref 135–146)
Total Bilirubin: 0.4 mg/dL (ref 0.2–1.2)
Total Protein: 7 g/dL (ref 6.1–8.1)
eGFR: 100 mL/min/1.73m2 (ref >=60)

## 2024-01-23 LAB — CBC WITH DIFFERENTIAL/PLATELET
Absolute Lymphocytes: 3649 {cells}/uL (ref 850–3900)
Absolute Monocytes: 749 {cells}/uL (ref 200–950)
Basophils Absolute: 43 {cells}/uL (ref 0–200)
Basophils Relative: 0.4 %
Eosinophils Absolute: 353 {cells}/uL (ref 15–500)
Eosinophils Relative: 3.3 %
HCT: 41.6 % (ref 35.9–46.0)
Hemoglobin: 13.4 g/dL (ref 11.7–15.5)
MCH: 29.3 pg (ref 27.0–33.0)
MCHC: 32.2 g/dL (ref 31.6–35.4)
MCV: 91 fL (ref 81.4–101.7)
MPV: 10.2 fL (ref 7.5–12.5)
Monocytes Relative: 7 %
Neutro Abs: 5906 {cells}/uL (ref 1500–7800)
Neutrophils Relative %: 55.2 %
Platelets: 413 Thousand/uL — ABNORMAL HIGH (ref 140–400)
RBC: 4.57 Million/uL (ref 3.80–5.10)
RDW: 13.9 % (ref 11.0–15.0)
Total Lymphocyte: 34.1 %
WBC: 10.7 Thousand/uL (ref 3.8–10.8)

## 2024-01-23 LAB — LIPID PANEL
Cholesterol: 171 mg/dL (ref <200)
HDL: 61 mg/dL (ref >=50)
LDL Cholesterol (Calc): 73 mg/dL
Non-HDL Cholesterol (Calc): 110 mg/dL (ref <130)
Total CHOL/HDL Ratio: 2.8 (calc) (ref <5.0)
Triglycerides: 280 mg/dL — ABNORMAL HIGH (ref <150)

## 2024-01-23 LAB — HEMOGLOBIN A1C
Hgb A1c MFr Bld: 8.3 % — ABNORMAL HIGH (ref <5.7)
Mean Plasma Glucose: 192 mg/dL
eAG (mmol/L): 10.6 mmol/L

## 2024-01-23 LAB — VITAMIN D 25 HYDROXY (VIT D DEFICIENCY, FRACTURES): Vit D, 25-Hydroxy: 41 ng/mL (ref 30–100)

## 2024-01-23 LAB — VITAMIN B12: Vitamin B-12: 301 pg/mL (ref 200–1100)

## 2024-01-23 LAB — MICROALBUMIN / CREATININE URINE RATIO

## 2024-01-24 ENCOUNTER — Telehealth: Payer: Self-pay

## 2024-01-24 ENCOUNTER — Ambulatory Visit: Payer: Self-pay

## 2024-01-24 NOTE — Telephone Encounter (Signed)
 Copied from CRM #[id number]. Topic: Clinical - Order For Equipment >> Jan 19, 2024 10:03 AM Dedra B wrote: Reason for CRM: Pt needs a prescription for prosthetic liners sent to [facility]. >> Jan 24, 2024 12:29 PM Antwanette L wrote: Patient was seen by the provider on 12/10 and was supposed to have an order sent for liners for her prosthetic leg. [facility] reports they have not received the order. Please have the provider resend the order to this pharmacy.

## 2024-01-24 NOTE — Telephone Encounter (Signed)
 Order is written, and ready for signature. Placed in providers folder for revew.

## 2024-01-24 NOTE — Telephone Encounter (Signed)
 FYI Only or Action Required?: FYI only for provider: appointment scheduled on 12/18.  Patient was last seen in primary care on 01/18/2024 by Kayla Jeoffrey RAMAN, FNP.  Called Nurse Triage reporting Vaginal Itching.  Symptoms began x 2 months ago.  Interventions attempted: Nothing.  Symptoms are: unchanged.  Triage Disposition: See PCP Within 2 Weeks  Patient/caregiver understands and will follow disposition?: Yes         Copied from CRM #[id number]. Topic: Clinical - Red Word Triage >> Jan 24, 2024 12:33 PM Antwanette L wrote: Red Word that prompted transfer to Nurse Triage: Pt is having itchiness and burring during urination. Pt needs to scheduled on ov for a urine sample Reason for Disposition  All other urine symptoms  Answer Assessment - Initial Assessment Questions 1. SYMPTOM: What's the main symptom you're concerned about? (e.g., frequency, incontinence)     Burning sensation, itch in the vagina around the external area. PCP is aware. Antibiotic Keflex  completed    2. ONSET: When did the  symptoms  start?     Ongoing x 2 months     3. PAIN: Is there any pain? If Yes, ask: How bad is it? (Scale: 1-10; mild, moderate, severe)     No pain    4. CAUSE: What do you think is causing the symptoms?      Unsure   5. OTHER SYMPTOMS: Do you have any other symptoms? (e.g., blood in urine, fever, flank pain, pain with urination)  No   Patient was seen on office on 12/10.    Patient called in to triage with complaints of persistent burning with urination, and vaginal itching This has been ongoing for x 2 months The patient stated she completed all the prescribed medication such as Keflex .  Appointment scheduled for further evaluation on 12/18 per patient request; She  agrees with the plan of care, and will reach out if symptoms worsen or persist.  Protocols used: Urinary Symptoms-A-AH

## 2024-01-25 ENCOUNTER — Telehealth: Payer: Self-pay

## 2024-01-25 NOTE — Telephone Encounter (Signed)
 ORDER FOR BK SUPPLIES dx D11.888J  FAXED TO [facility]  REVIEWED AND SIGNED BY PROVIDER AMBER HOWARD ON 01/25/2024  FAXED TO NUMBER [phone] SRP, CMA

## 2024-01-26 ENCOUNTER — Ambulatory Visit: Admitting: Family Medicine

## 2024-01-27 NOTE — Telephone Encounter (Signed)
 Order has been completed. Documentation is in a different encounter.

## 2024-02-10 ENCOUNTER — Telehealth: Payer: Self-pay | Admitting: Family Medicine

## 2024-02-10 NOTE — Telephone Encounter (Unsigned)
 Copied from CRM [id number]. Topic: Clinical - Order For Equipment >> Feb 10, 2024 10:34 AM Tiffini S wrote: Reason for CRM: Patient needs additional liners for her leg to protect prosthetic skin/ have one that is very wore- [facility] is waiting for the prescription to place a new order   Please call the patient [phone] to update

## 2024-02-13 ENCOUNTER — Ambulatory Visit: Admitting: Family Medicine

## 2024-02-15 ENCOUNTER — Ambulatory Visit

## 2024-02-16 ENCOUNTER — Other Ambulatory Visit: Payer: Self-pay | Admitting: Family Medicine

## 2024-02-16 DIAGNOSIS — E1165 Type 2 diabetes mellitus with hyperglycemia: Secondary | ICD-10-CM

## 2024-02-17 ENCOUNTER — Telehealth: Payer: Self-pay

## 2024-02-17 NOTE — Telephone Encounter (Signed)
 Copied from CRM [id number]. Topic: Clinical - Order For Equipment >> Feb 10, 2024 10:34 AM Tiffini S wrote: Reason for CRM: Patient needs additional liners for her leg to protect prosthetic skin/ have one that is very wore- [facility] is waiting for the prescription to place a new order   Please call the patient [phone] to update >> Feb 17, 2024 12:32 PM Donna BRAVO wrote: Patient spoke to [facility], the prescription has not been received. Patient is getting blisters on her leg due to old liner. Patient does not want to get an infection in her leg and states this is an urgent matter.   [facility] in [place] ph [phone]  [address] [place] [address]

## 2024-02-20 ENCOUNTER — Other Ambulatory Visit: Payer: Self-pay | Admitting: Family Medicine

## 2024-02-22 NOTE — Telephone Encounter (Signed)
 Prescription resent to [facility] by fax and email . Will follow up.

## 2024-02-27 ENCOUNTER — Ambulatory Visit: Admitting: Cardiology
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
## 2024-02-28 ENCOUNTER — Other Ambulatory Visit: Payer: Self-pay | Admitting: Family Medicine

## 2024-03-01 ENCOUNTER — Ambulatory Visit

## 2024-03-05 ENCOUNTER — Ambulatory Visit: Admitting: Cardiology

## 2024-03-07 ENCOUNTER — Encounter: Admitting: Family Medicine

## 2024-03-08 ENCOUNTER — Telehealth: Payer: Self-pay

## 2024-03-08 NOTE — Telephone Encounter (Signed)
 [facility] FAXED IN PRESCRTION - STANDARD WRITTEN ORDER ON 02/28/2024  DIAGNOSIS: 1. Z 89.511 ACQUIRED ABSENCE OF RIGHT LEG BELOW KNEE  [facility]  DESCRIPTION OF ITEM/ SERVICES ORDERED   1. L 5679- SOCKET INSERT W/O LOCK MECH : SIDE: RIGHT : QUANTITY : 2   1. O4314 - BLOW KNEE SUS/ SEAL SLEEVE : SIDE: RIGHT: QUANIITY : 2   FUNCTIONAL CAPACITY ( FROM KO- K4)  k3 ABILITY OR POTENTIAL TO AMBULATE WITH VARIABLE CADENCE; PERFORM ACTIVITIES BEYOND SIMPLE LOCOMOTION.   THE STATUS OF THE RESIDUAL LIMB IS READY FOR PROSTHETIC RECEIPT.    I AUTHORIZE THE ITEMS / SERVICES SHOWN ABOVE AND CERTIFY THAT THE INFORMATION PROVIDED HEREIN IS T RUE NAD ACCURATE REVIEWED AND SIGNED BY PROVIDER AMBER HOWARD. ON 03/08/2024  FAXED TO [facility] BY CMA SRP.  PHONE [phone] FAX [phone]  C/O STEVEN GROVE CPO.

## 2024-03-12 ENCOUNTER — Ambulatory Visit: Admitting: Cardiology

## 2024-03-14 ENCOUNTER — Telehealth: Payer: Self-pay

## 2024-03-14 NOTE — Telephone Encounter (Signed)
 [facility] faxed in request for documentation , chart notes, office visit notes, or medical records, pertaining to need for supplies and devices pertaining to amputation.   Faxed any and all clinical notes, chart notes and medical records to [facility] at  Fax [phone] on 03/14/2024  by CMA SRP

## 2024-03-15 ENCOUNTER — Telehealth: Payer: Self-pay

## 2024-03-15 NOTE — Telephone Encounter (Signed)
 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   DATE OF LAST F2F 01/18/2024 EXPECTED LENGTH OF NEED: INDEFINITELY  REVIEWED AND AUTHORIZED BY PROVIDER AMBER HOWARD ON 03/14/2024 FAXED TO [facility] @[phone] BY SRP, CMA ON 03/15/2024

## 2024-03-26 ENCOUNTER — Ambulatory Visit: Admitting: Cardiology
